# Patient Record
Sex: Male | Born: 1940 | Race: White | Hispanic: No | Marital: Married | Smoking: Never smoker
Health system: Southern US, Community
[De-identification: ages and names within clinical notes are randomized; demographics above are authoritative.]

## PROBLEM LIST (undated history)

## (undated) DIAGNOSIS — I10 Essential (primary) hypertension: Secondary | ICD-10-CM

## (undated) DIAGNOSIS — M5416 Radiculopathy, lumbar region: Secondary | ICD-10-CM

## (undated) DIAGNOSIS — Z9989 Dependence on other enabling machines and devices: Secondary | ICD-10-CM

## (undated) DIAGNOSIS — G4733 Obstructive sleep apnea (adult) (pediatric): Secondary | ICD-10-CM

## (undated) HISTORY — DX: Essential (primary) hypertension: I10

## (undated) HISTORY — DX: Radiculopathy, lumbar region: M54.16

## (undated) HISTORY — DX: Obstructive sleep apnea (adult) (pediatric): G47.33

## (undated) HISTORY — DX: Dependence on other enabling machines and devices: Z99.89

---

## 2013-03-24 DIAGNOSIS — E785 Hyperlipidemia, unspecified: Secondary | ICD-10-CM | POA: Diagnosis not present

## 2013-03-24 DIAGNOSIS — Z79899 Other long term (current) drug therapy: Secondary | ICD-10-CM | POA: Diagnosis not present

## 2013-03-31 DIAGNOSIS — R7301 Impaired fasting glucose: Secondary | ICD-10-CM | POA: Diagnosis not present

## 2013-03-31 DIAGNOSIS — E785 Hyperlipidemia, unspecified: Secondary | ICD-10-CM | POA: Diagnosis not present

## 2013-03-31 DIAGNOSIS — Z79899 Other long term (current) drug therapy: Secondary | ICD-10-CM | POA: Diagnosis not present

## 2013-03-31 DIAGNOSIS — K7689 Other specified diseases of liver: Secondary | ICD-10-CM | POA: Diagnosis not present

## 2013-03-31 DIAGNOSIS — I10 Essential (primary) hypertension: Secondary | ICD-10-CM | POA: Diagnosis not present

## 2013-04-29 DIAGNOSIS — K7689 Other specified diseases of liver: Secondary | ICD-10-CM | POA: Diagnosis not present

## 2013-06-22 DIAGNOSIS — Z23 Encounter for immunization: Secondary | ICD-10-CM | POA: Diagnosis not present

## 2013-10-01 DIAGNOSIS — I10 Essential (primary) hypertension: Secondary | ICD-10-CM | POA: Diagnosis not present

## 2013-10-01 DIAGNOSIS — R7301 Impaired fasting glucose: Secondary | ICD-10-CM | POA: Diagnosis not present

## 2013-10-01 DIAGNOSIS — E785 Hyperlipidemia, unspecified: Secondary | ICD-10-CM | POA: Diagnosis not present

## 2013-10-01 DIAGNOSIS — Z79899 Other long term (current) drug therapy: Secondary | ICD-10-CM | POA: Diagnosis not present

## 2013-10-06 DIAGNOSIS — F411 Generalized anxiety disorder: Secondary | ICD-10-CM | POA: Diagnosis not present

## 2013-10-06 DIAGNOSIS — R710 Precipitous drop in hematocrit: Secondary | ICD-10-CM | POA: Diagnosis not present

## 2013-10-06 DIAGNOSIS — I10 Essential (primary) hypertension: Secondary | ICD-10-CM | POA: Diagnosis not present

## 2013-10-06 DIAGNOSIS — Z23 Encounter for immunization: Secondary | ICD-10-CM | POA: Diagnosis not present

## 2013-10-06 DIAGNOSIS — R7301 Impaired fasting glucose: Secondary | ICD-10-CM | POA: Diagnosis not present

## 2013-10-06 DIAGNOSIS — E785 Hyperlipidemia, unspecified: Secondary | ICD-10-CM | POA: Diagnosis not present

## 2013-10-06 DIAGNOSIS — D539 Nutritional anemia, unspecified: Secondary | ICD-10-CM | POA: Diagnosis not present

## 2013-10-18 DIAGNOSIS — Z23 Encounter for immunization: Secondary | ICD-10-CM | POA: Diagnosis not present

## 2014-04-06 DIAGNOSIS — Z79899 Other long term (current) drug therapy: Secondary | ICD-10-CM | POA: Diagnosis not present

## 2014-04-06 DIAGNOSIS — E785 Hyperlipidemia, unspecified: Secondary | ICD-10-CM | POA: Diagnosis not present

## 2014-04-06 LAB — LIPID PANEL
CHOLESTEROL: 182 mg/dL (ref 0–200)
HDL: 48 mg/dL (ref 35–70)
LDL CALC: 85 mg/dL
Triglycerides: 241 mg/dL — AB (ref 40–160)

## 2014-04-06 LAB — BASIC METABOLIC PANEL
BUN: 22 mg/dL — AB (ref 4–21)
Creatinine: 1 mg/dL (ref 0.6–1.3)
GLUCOSE: 108 mg/dL
Potassium: 4.1 mmol/L (ref 3.4–5.3)
Sodium: 146 mmol/L (ref 137–147)

## 2014-04-06 LAB — HEPATIC FUNCTION PANEL
ALT: 34 U/L (ref 10–40)
AST: 34 U/L (ref 14–40)

## 2014-04-06 LAB — HEMOGLOBIN A1C: Hemoglobin A1C: 5.7

## 2014-04-14 DIAGNOSIS — I10 Essential (primary) hypertension: Secondary | ICD-10-CM | POA: Diagnosis not present

## 2014-04-14 DIAGNOSIS — Z79899 Other long term (current) drug therapy: Secondary | ICD-10-CM | POA: Diagnosis not present

## 2014-04-14 DIAGNOSIS — Z23 Encounter for immunization: Secondary | ICD-10-CM | POA: Diagnosis not present

## 2014-04-14 DIAGNOSIS — D492 Neoplasm of unspecified behavior of bone, soft tissue, and skin: Secondary | ICD-10-CM | POA: Diagnosis not present

## 2014-04-14 DIAGNOSIS — E785 Hyperlipidemia, unspecified: Secondary | ICD-10-CM | POA: Diagnosis not present

## 2014-04-14 DIAGNOSIS — R7301 Impaired fasting glucose: Secondary | ICD-10-CM | POA: Diagnosis not present

## 2014-05-10 DIAGNOSIS — L570 Actinic keratosis: Secondary | ICD-10-CM | POA: Diagnosis not present

## 2014-05-10 DIAGNOSIS — Z85828 Personal history of other malignant neoplasm of skin: Secondary | ICD-10-CM | POA: Diagnosis not present

## 2014-05-10 DIAGNOSIS — Z08 Encounter for follow-up examination after completed treatment for malignant neoplasm: Secondary | ICD-10-CM | POA: Diagnosis not present

## 2014-05-10 DIAGNOSIS — C44310 Basal cell carcinoma of skin of unspecified parts of face: Secondary | ICD-10-CM | POA: Diagnosis not present

## 2014-05-31 DIAGNOSIS — C44310 Basal cell carcinoma of skin of unspecified parts of face: Secondary | ICD-10-CM | POA: Diagnosis not present

## 2014-07-14 DIAGNOSIS — E785 Hyperlipidemia, unspecified: Secondary | ICD-10-CM | POA: Diagnosis not present

## 2014-08-04 DIAGNOSIS — R7301 Impaired fasting glucose: Secondary | ICD-10-CM | POA: Diagnosis not present

## 2014-08-04 DIAGNOSIS — I10 Essential (primary) hypertension: Secondary | ICD-10-CM | POA: Diagnosis not present

## 2014-08-04 DIAGNOSIS — E785 Hyperlipidemia, unspecified: Secondary | ICD-10-CM | POA: Diagnosis not present

## 2014-08-04 DIAGNOSIS — R072 Precordial pain: Secondary | ICD-10-CM | POA: Diagnosis not present

## 2014-08-05 DIAGNOSIS — Z87891 Personal history of nicotine dependence: Secondary | ICD-10-CM | POA: Diagnosis not present

## 2014-08-05 DIAGNOSIS — F419 Anxiety disorder, unspecified: Secondary | ICD-10-CM | POA: Diagnosis not present

## 2014-08-05 DIAGNOSIS — G4733 Obstructive sleep apnea (adult) (pediatric): Secondary | ICD-10-CM | POA: Diagnosis not present

## 2014-08-05 DIAGNOSIS — R079 Chest pain, unspecified: Secondary | ICD-10-CM | POA: Diagnosis not present

## 2014-08-05 DIAGNOSIS — J449 Chronic obstructive pulmonary disease, unspecified: Secondary | ICD-10-CM | POA: Diagnosis not present

## 2014-08-05 DIAGNOSIS — E785 Hyperlipidemia, unspecified: Secondary | ICD-10-CM | POA: Diagnosis not present

## 2014-08-05 DIAGNOSIS — I10 Essential (primary) hypertension: Secondary | ICD-10-CM | POA: Diagnosis not present

## 2014-08-05 DIAGNOSIS — Z7982 Long term (current) use of aspirin: Secondary | ICD-10-CM | POA: Diagnosis not present

## 2014-08-06 DIAGNOSIS — E785 Hyperlipidemia, unspecified: Secondary | ICD-10-CM | POA: Diagnosis not present

## 2014-08-06 DIAGNOSIS — I10 Essential (primary) hypertension: Secondary | ICD-10-CM | POA: Diagnosis not present

## 2014-08-06 DIAGNOSIS — R079 Chest pain, unspecified: Secondary | ICD-10-CM | POA: Diagnosis not present

## 2014-08-06 DIAGNOSIS — R0789 Other chest pain: Secondary | ICD-10-CM | POA: Diagnosis not present

## 2014-08-06 DIAGNOSIS — J449 Chronic obstructive pulmonary disease, unspecified: Secondary | ICD-10-CM | POA: Diagnosis not present

## 2014-08-11 DIAGNOSIS — L989 Disorder of the skin and subcutaneous tissue, unspecified: Secondary | ICD-10-CM | POA: Diagnosis not present

## 2014-08-11 DIAGNOSIS — I10 Essential (primary) hypertension: Secondary | ICD-10-CM | POA: Diagnosis not present

## 2014-08-11 DIAGNOSIS — R072 Precordial pain: Secondary | ICD-10-CM | POA: Diagnosis not present

## 2014-08-11 DIAGNOSIS — F419 Anxiety disorder, unspecified: Secondary | ICD-10-CM | POA: Diagnosis not present

## 2014-08-24 DIAGNOSIS — J449 Chronic obstructive pulmonary disease, unspecified: Secondary | ICD-10-CM | POA: Diagnosis not present

## 2014-08-24 DIAGNOSIS — R072 Precordial pain: Secondary | ICD-10-CM | POA: Diagnosis not present

## 2014-08-24 DIAGNOSIS — E782 Mixed hyperlipidemia: Secondary | ICD-10-CM | POA: Diagnosis not present

## 2014-08-24 DIAGNOSIS — I10 Essential (primary) hypertension: Secondary | ICD-10-CM | POA: Diagnosis not present

## 2014-08-30 DIAGNOSIS — L219 Seborrheic dermatitis, unspecified: Secondary | ICD-10-CM | POA: Diagnosis not present

## 2014-09-15 DIAGNOSIS — E785 Hyperlipidemia, unspecified: Secondary | ICD-10-CM | POA: Diagnosis not present

## 2014-09-15 DIAGNOSIS — J309 Allergic rhinitis, unspecified: Secondary | ICD-10-CM | POA: Diagnosis not present

## 2014-09-15 DIAGNOSIS — F419 Anxiety disorder, unspecified: Secondary | ICD-10-CM | POA: Diagnosis not present

## 2014-09-15 DIAGNOSIS — I10 Essential (primary) hypertension: Secondary | ICD-10-CM | POA: Diagnosis not present

## 2014-09-21 DIAGNOSIS — Z23 Encounter for immunization: Secondary | ICD-10-CM | POA: Diagnosis not present

## 2014-09-21 DIAGNOSIS — I10 Essential (primary) hypertension: Secondary | ICD-10-CM | POA: Diagnosis not present

## 2014-09-21 DIAGNOSIS — R072 Precordial pain: Secondary | ICD-10-CM | POA: Diagnosis not present

## 2014-09-21 DIAGNOSIS — E785 Hyperlipidemia, unspecified: Secondary | ICD-10-CM | POA: Diagnosis not present

## 2014-09-21 DIAGNOSIS — R7301 Impaired fasting glucose: Secondary | ICD-10-CM | POA: Diagnosis not present

## 2014-09-21 DIAGNOSIS — Z79899 Other long term (current) drug therapy: Secondary | ICD-10-CM | POA: Diagnosis not present

## 2014-11-29 DIAGNOSIS — C4441 Basal cell carcinoma of skin of scalp and neck: Secondary | ICD-10-CM | POA: Diagnosis not present

## 2014-11-29 DIAGNOSIS — L718 Other rosacea: Secondary | ICD-10-CM | POA: Diagnosis not present

## 2015-02-20 ENCOUNTER — Encounter: Payer: Self-pay | Admitting: Family Medicine

## 2015-02-20 ENCOUNTER — Ambulatory Visit (INDEPENDENT_AMBULATORY_CARE_PROVIDER_SITE_OTHER): Payer: Medicare Other | Admitting: Family Medicine

## 2015-02-20 VITALS — BP 137/75 | HR 65 | Ht 66.54 in | Wt 189.0 lb

## 2015-02-20 DIAGNOSIS — Z9989 Dependence on other enabling machines and devices: Secondary | ICD-10-CM

## 2015-02-20 DIAGNOSIS — E785 Hyperlipidemia, unspecified: Secondary | ICD-10-CM | POA: Diagnosis not present

## 2015-02-20 DIAGNOSIS — L719 Rosacea, unspecified: Secondary | ICD-10-CM | POA: Insufficient documentation

## 2015-02-20 DIAGNOSIS — I10 Essential (primary) hypertension: Secondary | ICD-10-CM | POA: Diagnosis not present

## 2015-02-20 DIAGNOSIS — G4733 Obstructive sleep apnea (adult) (pediatric): Secondary | ICD-10-CM

## 2015-02-20 DIAGNOSIS — R413 Other amnesia: Secondary | ICD-10-CM

## 2015-02-20 DIAGNOSIS — M5416 Radiculopathy, lumbar region: Secondary | ICD-10-CM | POA: Diagnosis not present

## 2015-02-20 DIAGNOSIS — E781 Pure hyperglyceridemia: Secondary | ICD-10-CM | POA: Insufficient documentation

## 2015-02-20 HISTORY — DX: Radiculopathy, lumbar region: M54.16

## 2015-02-20 HISTORY — DX: Essential (primary) hypertension: I10

## 2015-02-20 HISTORY — DX: Obstructive sleep apnea (adult) (pediatric): G47.33

## 2015-02-20 MED ORDER — GABAPENTIN 300 MG PO CAPS: 300.0000 mg | ORAL_CAPSULE | Freq: Every evening | ORAL | Status: DC | PRN

## 2015-02-20 NOTE — Progress Notes (Addendum)
CC: Anthony Davila is a 75 y.o. male is here for Establish Care; short term memory loss; and Medication Management   Subjective: HPI:  Very pleasant 75 year old here to establish care  He reports a history of essential hypertension. He is not sure how long he's had this but is currently on metoprolol and Hyzaar with no outside blood pressures report. He denies any known side effects. No chest pain shortness of breath or peripheral edema  He has a history of hyperlipidemia and hypertriglyceridemia. He is currently on atorvastatin and fenofibrate acid. He wants to stop taking medications if possible. He tells me his hypertriglyceridemia was diagnosed about a year ago and he's never had this problem before.  Complains of midline low back pain that radiates down the left leg that has been present ever since he lifted a heavy object 2013 and had sudden onset of pain. He's received epidural injections but they were not entirely beneficial. He's currently not on any medication for this issue. He complains of daily numbness and pain in his left foot without any weakness.  He has a history of rosacea and is currently taking minocycline. He denies any facial or skin lesions.  His major complaint today is memory loss that has been identified by family members and also the patient experiences difficulty with occasional word finding. He denies any giving lost or disorientation. Symptoms of an slowly more noticeable over the past year. Symptoms occur on a daily basis to a mild degree. Nothing seems to make symptoms better or worse  Review of Systems - General ROS: negative for - chills, fever, night sweats, weight gain or weight loss Ophthalmic ROS: negative for - decreased vision Psychological ROS: negative for - anxiety or depression ENT ROS: negative for - hearing change, nasal congestion, tinnitus or allergies Hematological and Lymphatic ROS: negative for - bleeding problems, bruising or swollen lymph  nodes Breast ROS: negative Respiratory ROS: no cough, shortness of breath, or wheezing Cardiovascular ROS: no chest pain or dyspnea on exertion Gastrointestinal ROS: no abdominal pain, change in bowel habits, or black or bloody stools Genito-Urinary ROS: negative for - genital discharge, genital ulcers, incontinence or abnormal bleeding from genitals Musculoskeletal ROS: negative for - joint pain or muscle pain other than that described above Neurological ROS: negative for - headaches Dermatological ROS: negative for lumps, mole changes, rash and skin lesion changes  Past Medical History  Diagnosis Date  . Essential hypertension 02/20/2015  . OSA on CPAP 02/20/2015  . Left lumbar radiculitis 02/20/2015    History reviewed. No pertinent past surgical history. History reviewed. No pertinent family history.  Social History   Social History  . Marital Status: Married    Spouse Name: N/A  . Number of Children: N/A  . Years of Education: N/A   Occupational History  . Not on file.   Social History Main Topics  . Smoking status: Never Smoker   . Smokeless tobacco: Not on file  . Alcohol Use: Not on file  . Drug Use: Not on file  . Sexual Activity: Not on file   Other Topics Concern  . Not on file   Social History Narrative     Objective: BP 137/75 mmHg  Pulse 65  Ht 5' 6.53" (1.69 m)  Wt 189 lb (85.73 kg)  BMI 30.02 kg/m2  General: Alert and Oriented, No Acute Distress HEENT: Pupils equal, round, reactive to light. Conjunctivae clear. Moist mucous membranes Lungs: Clear to auscultation bilaterally, no wheezing/ronchi/rales.  Comfortable work  of breathing. Good air movement. Cardiac: Regular rate and rhythm. Normal S1/S2.  No murmurs, rubs, nor gallops.   Extremities: No peripheral edema.  Strong peripheral pulses.  Mental Status: No depression, anxiety, nor agitation. Skin: Warm and dry.  Assessment & Plan: Anthony Davila was seen today for establish care, short term memory  loss and medication management.  Diagnoses and all orders for this visit:  Essential hypertension  Hyperlipidemia  OSA on CPAP  Left lumbar radiculitis  Hypertriglyceridemia -     Lipid panel  Memory loss -     Vitamin B12 -     Vitamin D (25 hydroxy) -     TSH  Rosacea  Other orders -     gabapentin (NEURONTIN) 300 MG capsule; Take 1 capsule (300 mg total) by mouth at bedtime and may repeat dose one time if needed. To prevent nerve pain.   Essential hypertension: Controlled continue metoprolol and Hyzaar  hyperlipidemia: Since he wants to stop some medication if possible he will Stop fenofibrate acid and we will check lipid panel in 2-3 weeks. Left lumbar radiculitis: Trial of gabapentin at night. Memory loss: MMSE today was  27 out of 30. Will look for reversible causes with labs above. Rosacea: Controlled continue minocycline.  He does not need refills on his medications today.  Return in about 4 weeks (around 03/20/2015).

## 2015-02-23 ENCOUNTER — Encounter: Payer: Self-pay | Admitting: Family Medicine

## 2015-03-06 ENCOUNTER — Other Ambulatory Visit: Payer: Self-pay

## 2015-03-06 DIAGNOSIS — R413 Other amnesia: Secondary | ICD-10-CM | POA: Diagnosis not present

## 2015-03-06 DIAGNOSIS — E781 Pure hyperglyceridemia: Secondary | ICD-10-CM | POA: Diagnosis not present

## 2015-03-06 MED ORDER — LOSARTAN POTASSIUM-HCTZ 100-12.5 MG PO TABS: 1.0000 | ORAL_TABLET | Freq: Every day | ORAL | Status: DC

## 2015-03-06 MED ORDER — SERTRALINE HCL 50 MG PO TABS
50.0000 mg | ORAL_TABLET | Freq: Every day | ORAL | Status: DC
Start: 2015-03-06 — End: 2015-05-07

## 2015-03-06 MED ORDER — ATORVASTATIN CALCIUM 20 MG PO TABS: 20.0000 mg | ORAL_TABLET | Freq: Every day | ORAL | Status: DC

## 2015-03-07 ENCOUNTER — Telehealth: Payer: Self-pay | Admitting: Family Medicine

## 2015-03-07 DIAGNOSIS — E559 Vitamin D deficiency, unspecified: Secondary | ICD-10-CM | POA: Insufficient documentation

## 2015-03-07 LAB — LIPID PANEL
CHOL/HDL RATIO: 4.1 ratio (ref <=5.0)
CHOLESTEROL: 131 mg/dL (ref 125–200)
HDL: 32 mg/dL — AB (ref >=40)
LDL Cholesterol: 44 mg/dL (ref <130)
Triglycerides: 277 mg/dL — ABNORMAL HIGH (ref <150)
VLDL: 55 mg/dL — ABNORMAL HIGH (ref <30)

## 2015-03-07 LAB — VITAMIN B12: Vitamin B-12: 944 pg/mL (ref 200–1100)

## 2015-03-07 LAB — TSH: TSH: 4.49 mIU/L (ref 0.40–4.50)

## 2015-03-07 LAB — VITAMIN D 25 HYDROXY (VIT D DEFICIENCY, FRACTURES): Vit D, 25-Hydroxy: 24 ng/mL — ABNORMAL LOW (ref 30–100)

## 2015-03-07 MED ORDER — VITAMIN D (ERGOCALCIFEROL) 1.25 MG (50000 UNIT) PO CAPS: 50000.0000 [IU] | ORAL_CAPSULE | ORAL | Status: DC

## 2015-03-07 NOTE — Telephone Encounter (Signed)
Awaiting call back.

## 2015-03-07 NOTE — Telephone Encounter (Signed)
Pt.notified

## 2015-03-07 NOTE — Telephone Encounter (Signed)
Will you please let patient know that his thyroid function, vitamin B, and LDL cholesterol were all normal. His vitamin D level was deficient which could be the cause of his memory loss, I"ll send in weekly supplement and if this doesn't help after one month please follow up to discuss other medication options.

## 2015-03-15 ENCOUNTER — Other Ambulatory Visit: Payer: Self-pay

## 2015-03-15 MED ORDER — METOPROLOL TARTRATE 25 MG PO TABS: ORAL_TABLET | ORAL | Status: DC

## 2015-03-20 ENCOUNTER — Encounter: Payer: Self-pay | Admitting: Family Medicine

## 2015-03-20 ENCOUNTER — Ambulatory Visit (INDEPENDENT_AMBULATORY_CARE_PROVIDER_SITE_OTHER): Payer: Medicare Other | Admitting: Family Medicine

## 2015-03-20 VITALS — BP 120/71 | HR 61 | Wt 192.0 lb

## 2015-03-20 DIAGNOSIS — R413 Other amnesia: Secondary | ICD-10-CM | POA: Diagnosis not present

## 2015-03-20 DIAGNOSIS — F411 Generalized anxiety disorder: Secondary | ICD-10-CM | POA: Diagnosis not present

## 2015-03-20 DIAGNOSIS — M5416 Radiculopathy, lumbar region: Secondary | ICD-10-CM

## 2015-03-20 DIAGNOSIS — E559 Vitamin D deficiency, unspecified: Secondary | ICD-10-CM

## 2015-03-20 DIAGNOSIS — H9313 Tinnitus, bilateral: Secondary | ICD-10-CM | POA: Diagnosis not present

## 2015-03-20 MED ORDER — GABAPENTIN 600 MG PO TABS: 600.0000 mg | ORAL_TABLET | Freq: Every day | ORAL | Status: DC

## 2015-03-20 MED ORDER — METOPROLOL TARTRATE 25 MG PO TABS: ORAL_TABLET | ORAL | Status: DC

## 2015-03-20 NOTE — Progress Notes (Signed)
CC: Anthony Davila is a 75 y.o. male is here for Memory Loss; Leg Pain; Headache; Panic Attack; and Chest Pain   Subjective: HPI:  Follow-up memory loss: Family and patient has not noticed any further short-term memory loss since I saw him last. No disorientation or getting lost. No recent behavior outbursts. He doesn't symptoms are mild and occurring on a daily basis. He denies any long-term memory loss. He is able to remember and recite all medication changes in our office in outside offices over the past 3 months without difficulty.  Complains of tinnitus bilaterally. It's been going on for years. It slightly improved when taking Lipoflavanoid however the medication is getting too costly and I wonder if there something else that's generic. He denies ear pain or hearing loss.  Follow-up anxiety: He is been taking Zoloft now for over a year. He denies any anxiety but is worried he may have had a panic attack on Sunday of last week. It was described as a chilly sensation with abdominal cramping, it lasted a few minutes and occurred at rest. He tells me he has not been to be anxious about and denies any other mental disturbance.  Gabapentin has been helping him stay asleep and ignore left lumbar radiculitis at night. He denies any known side effects. His symptoms remain to a mild degree in the daytime. Denies any other motor or sensory disturbances other than pain   Review Of Systems Outlined In HPI  Past Medical History  Diagnosis Date  . Essential hypertension 02/20/2015  . OSA on CPAP 02/20/2015  . Left lumbar radiculitis 02/20/2015    No past surgical history on file. No family history on file.  Social History   Social History  . Marital Status: Married    Spouse Name: N/A  . Number of Children: N/A  . Years of Education: N/A   Occupational History  . Not on file.   Social History Main Topics  . Smoking status: Never Smoker   . Smokeless tobacco: Not on file  . Alcohol Use: Not  on file  . Drug Use: Not on file  . Sexual Activity: Not on file   Other Topics Concern  . Not on file   Social History Narrative     Objective: BP 120/71 mmHg  Pulse 61  Wt 192 lb (87.091 kg)  General: Alert and Oriented, No Acute Distress HEENT: Pupils equal, round, reactive to light. Conjunctivae clear.  External ears unremarkable, canals clear with intact TMs with appropriate landmarks.  Middle ear appears open without effusion. Pink inferior turbinates.  Moist mucous membranes, pharynx without inflammation nor lesions.  Neck supple without palpable lymphadenopathy nor abnormal masses. Lungs: Clear to auscultation bilaterally, no wheezing/ronchi/rales.  Comfortable work of breathing. Good air movement. Cardiac: Regular rate and rhythm. Normal S1/S2.  No murmurs, rubs, nor gallops.   Extremities: No peripheral edema.  Strong peripheral pulses.  Mental Status: No depression, anxiety, nor agitation. Skin: Warm and dry.  Assessment & Plan: Browning was seen today for memory loss, leg pain, headache, panic attack and chest pain.  Diagnoses and all orders for this visit:  Memory loss  Vitamin D deficiency  Tinnitus, bilateral  Generalized anxiety disorder  Left lumbar radiculitis  Other orders -     metoprolol tartrate (LOPRESSOR) 25 MG tablet; TAKE Half TABLET 2 TIMES A DAY for blood pressure control. -     gabapentin (NEURONTIN) 600 MG tablet; Take 1 tablet (600 mg total) by mouth at bedtime.  To help nerve pain in leg.   Memory loss: Stable no further intervention other than continue full 3 months of weekly vitamin D supplementation Bilateral tinnitus: Trial of over-the-counter fish oil since this is the main ingredient with Lipoflavanoid Anxiety: Controlled with Zoloft Left lumbar radiculitis: Improvement with gabapentin, increasing dose  Has been experiencing some lightheadedness when going from a seated to standing position therefore cutting back on  metoprolol.  Return in about 3 months (around 06/20/2015) for Vitamin D Recheck.

## 2015-03-24 ENCOUNTER — Encounter: Payer: Self-pay | Admitting: Family Medicine

## 2015-05-07 ENCOUNTER — Other Ambulatory Visit: Payer: Self-pay | Admitting: Family Medicine

## 2015-05-30 DIAGNOSIS — Z08 Encounter for follow-up examination after completed treatment for malignant neoplasm: Secondary | ICD-10-CM | POA: Diagnosis not present

## 2015-05-30 DIAGNOSIS — L219 Seborrheic dermatitis, unspecified: Secondary | ICD-10-CM | POA: Diagnosis not present

## 2015-05-30 DIAGNOSIS — L570 Actinic keratosis: Secondary | ICD-10-CM | POA: Diagnosis not present

## 2015-05-30 DIAGNOSIS — Z85828 Personal history of other malignant neoplasm of skin: Secondary | ICD-10-CM | POA: Diagnosis not present

## 2015-06-08 ENCOUNTER — Other Ambulatory Visit: Payer: Self-pay | Admitting: Family Medicine

## 2015-06-13 ENCOUNTER — Other Ambulatory Visit: Payer: Self-pay | Admitting: Family Medicine

## 2015-06-21 ENCOUNTER — Ambulatory Visit: Payer: Medicare Other | Admitting: Family Medicine

## 2015-06-26 ENCOUNTER — Ambulatory Visit (INDEPENDENT_AMBULATORY_CARE_PROVIDER_SITE_OTHER): Payer: Medicare Other | Admitting: Family Medicine

## 2015-06-26 ENCOUNTER — Encounter: Payer: Self-pay | Admitting: Family Medicine

## 2015-06-26 VITALS — BP 128/80 | HR 65 | Wt 189.0 lb

## 2015-06-26 DIAGNOSIS — I10 Essential (primary) hypertension: Secondary | ICD-10-CM

## 2015-06-26 DIAGNOSIS — E559 Vitamin D deficiency, unspecified: Secondary | ICD-10-CM | POA: Diagnosis not present

## 2015-06-26 DIAGNOSIS — M5416 Radiculopathy, lumbar region: Secondary | ICD-10-CM

## 2015-06-26 DIAGNOSIS — R079 Chest pain, unspecified: Secondary | ICD-10-CM

## 2015-06-26 DIAGNOSIS — R1031 Right lower quadrant pain: Secondary | ICD-10-CM | POA: Diagnosis not present

## 2015-06-26 LAB — CBC WITH DIFFERENTIAL/PLATELET
BASOS PCT: 0 %
Basophils Absolute: 0 cells/uL (ref 0–200)
EOS PCT: 5 %
Eosinophils Absolute: 265 cells/uL (ref 15–500)
HCT: 38.2 % — ABNORMAL LOW (ref 38.5–50.0)
HEMOGLOBIN: 13.1 g/dL — AB (ref 13.2–17.1)
LYMPHS ABS: 1431 {cells}/uL (ref 850–3900)
Lymphocytes Relative: 27 %
MCH: 32.3 pg (ref 27.0–33.0)
MCHC: 34.3 g/dL (ref 32.0–36.0)
MCV: 94.1 fL (ref 80.0–100.0)
MPV: 12.4 fL (ref 7.5–12.5)
Monocytes Absolute: 371 cells/uL (ref 200–950)
Monocytes Relative: 7 %
NEUTROS ABS: 3233 {cells}/uL (ref 1500–7800)
Neutrophils Relative %: 61 %
Platelets: 141 10*3/uL (ref 140–400)
RBC: 4.06 MIL/uL — AB (ref 4.20–5.80)
RDW: 13.9 % (ref 11.0–15.0)
WBC: 5.3 10*3/uL (ref 3.8–10.8)

## 2015-06-26 NOTE — Progress Notes (Signed)
CC: Anthony Davila is a 75 y.o. male is here for Follow-up   Subjective: HPI:  Follow-up vitamin D deficiency: He is taking 3 full months of vitamin D supplementation. He tells me he is not sure if it helped with his memory but he doesn't see anything that occurred because of taking the medication.  Follow essential hypertension: Since lowering his metoprolol he is no longer having any orthostatic like symptoms. No outside blood pressures to report.  He has a history of left lumbar radiculitis. He once received injections that were most likely epidural injections. He is having worsening pain that runs down the back of the left leg and ends with burning from the ankle distally. He denies any overlying skin changes or any other motor or sensory disturbances. Gabapentin is becoming more ineffective.  Complains of right lower quadrant plane that has been present for the last week. He's not sure what makes it better or worse. Described as dull and nonradiating. No relation to diet or bowel movement habits. He denies any nausea, vomiting, decreased appetite, constipation or diarrhea. He's worried that this might be coming from his appendix. He denies fevers, chills or flushing  He's also been having some chest discomfort for the past 5 months. He describes it as "it feels like my heart is suspended in the chest". He denies any exertional component to his pain, it's not completely by shortness of breath, he cannot influence the pain with breathing or any positions of his chest. Denies cough or wheezing. Pain is present on a daily basis and mild in severity at its worst. A little less than a year ago he had a normal stress test   Review Of Systems Outlined In HPI  Past Medical History  Diagnosis Date  . Essential hypertension 02/20/2015  . OSA on CPAP 02/20/2015  . Left lumbar radiculitis 02/20/2015    No past surgical history on file. No family history on file.  Social History   Social History  .  Marital Status: Married    Spouse Name: N/A  . Number of Children: N/A  . Years of Education: N/A   Occupational History  . Not on file.   Social History Main Topics  . Smoking status: Never Smoker   . Smokeless tobacco: Not on file  . Alcohol Use: Not on file  . Drug Use: Not on file  . Sexual Activity: Not on file   Other Topics Concern  . Not on file   Social History Narrative     Objective: BP 128/80 mmHg  Pulse 65  Wt 189 lb (85.73 kg)  General: Alert and Oriented, No Acute Distress HEENT: Pupils equal, round, reactive to light. Conjunctivae clear. Moist mucous membranes Lungs: Clear to auscultation bilaterally, no wheezing/ronchi/rales.  Comfortable work of breathing. Good air movement. Cardiac: Regular rate and rhythm. Normal S1/S2.  No murmurs, rubs, nor gallops.   Abdomen: Normal bowel sounds, soft and non tender without palpable masses. Reproduction of pain with palpation in the right lower quadrant nor any rebound tenderness Extremities: No peripheral edema.  Strong peripheral pulses.  Mental Status: No depression, anxiety, nor agitation. Skin: Warm and dry.  Assessment & Plan: Anthony Davila was seen today for follow-up.  Diagnoses and all orders for this visit:  Vitamin D deficiency -     VITAMIN D 25 Hydroxy (Vit-D Deficiency, Fractures)  Essential hypertension  Left lumbar radiculitis -     Ambulatory referral to Neurology  RLQ abdominal pain -  CBC with Differential  Chest pain, unspecified chest pain type -     ECHO COMPLETE; Future   D deficiency: Due for repeat D level today Essential hypertension: Improving and controlled with no longer experiencing orthostatic symptoms Left lumbar radiculitis: Given his worsening discomfort and radicular pain I recommended nerve conduction study, they specifically would like Dr.Bey, referral placed Right lower quadrant pain: Checking Ngo count, low suspicion for appendicitis at this time given lack of  rebound tenderness Chest discomfort: Given and is not necessarily pain or discomfort per se but rather a weird sensation described as "suspended" we'll start with an echocardiogram rather than repeating a stress test  25 minutes spent face-to-face during visit today of which at least 50% was counseling or coordinating care regarding: 1. Vitamin D deficiency   2. Essential hypertension   3. Left lumbar radiculitis   4. RLQ abdominal pain   5. Chest pain, unspecified chest pain type      No Follow-up on file.

## 2015-06-27 ENCOUNTER — Telehealth: Payer: Self-pay | Admitting: Family Medicine

## 2015-06-27 LAB — VITAMIN D 25 HYDROXY (VIT D DEFICIENCY, FRACTURES): VIT D 25 HYDROXY: 33 ng/mL (ref 30–100)

## 2015-06-27 MED ORDER — DICYCLOMINE HCL 10 MG PO CAPS: 10.0000 mg | ORAL_CAPSULE | Freq: Three times a day (TID) | ORAL | Status: DC | PRN

## 2015-06-27 MED ORDER — VITAMIN D 1000 UNITS PO TABS: 1000.0000 [IU] | ORAL_TABLET | Freq: Every day | ORAL | Status: DC

## 2015-06-27 NOTE — Telephone Encounter (Signed)
Results and recommendations left on daughter Anthony Davila) vm

## 2015-06-27 NOTE — Telephone Encounter (Signed)
Will you please let patient know that his vitamin D level is now normal and can be kept normal by taking a daily 1000 unit OTC vitamin D supplement.  Also his Blume blood cell count was normal which makes appendicitis very unlikely.  I suspect his abdominal discomfort is coming some some colon spasms that can be relieved by taking a new medication called dicyclomine on an as needed basis, this was sent [facility].

## 2015-06-29 ENCOUNTER — Other Ambulatory Visit: Payer: Self-pay | Admitting: Family Medicine

## 2015-07-10 ENCOUNTER — Other Ambulatory Visit: Payer: Self-pay | Admitting: Family Medicine

## 2015-07-24 ENCOUNTER — Other Ambulatory Visit: Payer: Self-pay | Admitting: Family Medicine

## 2015-08-17 DIAGNOSIS — M5432 Sciatica, left side: Secondary | ICD-10-CM | POA: Diagnosis not present

## 2015-08-17 DIAGNOSIS — G629 Polyneuropathy, unspecified: Secondary | ICD-10-CM | POA: Diagnosis not present

## 2015-08-23 ENCOUNTER — Other Ambulatory Visit: Payer: Self-pay | Admitting: Family Medicine

## 2015-09-07 ENCOUNTER — Other Ambulatory Visit: Payer: Self-pay | Admitting: Family Medicine

## 2015-09-07 DIAGNOSIS — M5432 Sciatica, left side: Secondary | ICD-10-CM | POA: Diagnosis not present

## 2015-09-08 ENCOUNTER — Ambulatory Visit (INDEPENDENT_AMBULATORY_CARE_PROVIDER_SITE_OTHER): Payer: Medicare Other

## 2015-09-08 ENCOUNTER — Ambulatory Visit (INDEPENDENT_AMBULATORY_CARE_PROVIDER_SITE_OTHER): Payer: Medicare Other | Admitting: Physician Assistant

## 2015-09-08 ENCOUNTER — Encounter: Payer: Self-pay | Admitting: Physician Assistant

## 2015-09-08 VITALS — BP 136/72 | HR 70 | Ht 66.25 in | Wt 186.0 lb

## 2015-09-08 DIAGNOSIS — R51 Headache: Secondary | ICD-10-CM | POA: Diagnosis not present

## 2015-09-08 DIAGNOSIS — R519 Headache, unspecified: Secondary | ICD-10-CM

## 2015-09-08 DIAGNOSIS — M4802 Spinal stenosis, cervical region: Secondary | ICD-10-CM | POA: Diagnosis not present

## 2015-09-08 DIAGNOSIS — H9193 Unspecified hearing loss, bilateral: Secondary | ICD-10-CM

## 2015-09-08 DIAGNOSIS — M47812 Spondylosis without myelopathy or radiculopathy, cervical region: Secondary | ICD-10-CM | POA: Insufficient documentation

## 2015-09-08 DIAGNOSIS — M479 Spondylosis, unspecified: Secondary | ICD-10-CM | POA: Diagnosis not present

## 2015-09-08 DIAGNOSIS — M542 Cervicalgia: Secondary | ICD-10-CM

## 2015-09-08 DIAGNOSIS — R413 Other amnesia: Secondary | ICD-10-CM

## 2015-09-08 MED ORDER — ATORVASTATIN CALCIUM 20 MG PO TABS: 20.0000 mg | ORAL_TABLET | Freq: Every day | ORAL | 5 refills | Status: DC

## 2015-09-08 MED ORDER — SERTRALINE HCL 50 MG PO TABS
50.0000 mg | ORAL_TABLET | Freq: Every day | ORAL | 5 refills | Status: DC
Start: 2015-09-08 — End: 2016-02-06

## 2015-09-08 MED ORDER — CYCLOBENZAPRINE HCL 10 MG PO TABS: 10.0000 mg | ORAL_TABLET | Freq: Every day | ORAL | 1 refills | Status: DC

## 2015-09-08 MED ORDER — MELOXICAM 15 MG PO TABS
15.0000 mg | ORAL_TABLET | Freq: Every day | ORAL | 1 refills | Status: DC
Start: 2015-09-08 — End: 2015-11-27

## 2015-09-08 MED ORDER — GABAPENTIN 600 MG PO TABS
ORAL_TABLET | ORAL | 5 refills | Status: DC
Start: 2015-09-08 — End: 2016-03-15

## 2015-09-08 MED ORDER — METOPROLOL TARTRATE 25 MG PO TABS: ORAL_TABLET | ORAL | 5 refills | Status: DC

## 2015-09-08 MED ORDER — LOSARTAN POTASSIUM-HCTZ 100-12.5 MG PO TABS: 1.0000 | ORAL_TABLET | Freq: Every day | ORAL | 5 refills | Status: DC

## 2015-09-08 NOTE — Progress Notes (Signed)
   Subjective:    Patient ID: Anthony Davila, male    DOB: Nov 22, 1940, 75 y.o.   MRN: [id number]  HPI  Patient is a 75 year old male who presents to the clinic to reestablish care with myself. Dr. Barbaraann Barthel is sleeping the office and he was a patient of his.  Dr. Barbaraann Barthel had started him on vitamin D to help with his short-term memory loss. For a while he did think this was helping. They have since noticed a lot of memory decline. There are even been Sunday school teachers that have discussed this with his wife and daughter. He seems to be asking the same questions over and over again. Daughter is very concerned about him driving a motor vehicle. At times he does not seem to comprehend what is going on.   They also are concerned because he's having frequent headaches. For the last 4 weeks he has had a headache almost every day. He describes the headaches as more in the back of his head and he feels like his head "is about to explode". His been taking ibuprofen to 400 mg tablets multiple times a day. He denies any light sensitivity, sound sensitivity, smell sensitivity. He denies any nausea or vomiting. He does not have a history of migraines. He does admit some neck pain. He denies any neck injury. He often feels like his neck is stiff. He does admit to not doing anything during the day. He seems to watch TV most of the day.  They are also aware that his hearing has declined. They're wondering if some of this is due to his hearing loss.  Review of Systems    see HPI>  Objective:   Physical Exam  Constitutional: He is oriented to person, place, and time. He appears well-developed and well-nourished.  HENT:  Head: Normocephalic and atraumatic.  Cardiovascular: Normal rate, regular rhythm and normal heart sounds.   Pulmonary/Chest: Effort normal and breath sounds normal.  Neurological: He is alert and oriented to person, place, and time.  Had to repeat commands a few times to understand what to do.    Psychiatric: He has a normal mood and affect. His behavior is normal.          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 Will get cervical spine xray. Given flexeril to take 1/2 tablet at bedtime to help relax muscles. mobic to take daily. Encouraged warm compresses, good supportive pillow and good neck support while watching TV all day. Massages could also help. Follow up as needed.   Memory changes- family very concerned. MMSE was normal at first visit with Dr. Ileene Rubens at 27/30. Pt's family request neurology referral. Talked with patient and he agrees not to drive until appt. Pt continues on Vitamin D.   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 Pt has medicare. Gave options on where to go for affordable hearing aids. Some of his repeating and confusion could be due to hearing loss.   Spent 30 minutes with patient and greater than 50 percent of visit spent counseling patient regarding treatment plan.

## 2015-10-05 DIAGNOSIS — M5117 Intervertebral disc disorders with radiculopathy, lumbosacral region: Secondary | ICD-10-CM | POA: Diagnosis not present

## 2015-10-05 DIAGNOSIS — M4726 Other spondylosis with radiculopathy, lumbar region: Secondary | ICD-10-CM | POA: Diagnosis not present

## 2015-10-05 DIAGNOSIS — M4727 Other spondylosis with radiculopathy, lumbosacral region: Secondary | ICD-10-CM | POA: Diagnosis not present

## 2015-10-05 DIAGNOSIS — M5116 Intervertebral disc disorders with radiculopathy, lumbar region: Secondary | ICD-10-CM | POA: Diagnosis not present

## 2015-10-05 DIAGNOSIS — M4806 Spinal stenosis, lumbar region: Secondary | ICD-10-CM | POA: Diagnosis not present

## 2015-10-05 DIAGNOSIS — M47816 Spondylosis without myelopathy or radiculopathy, lumbar region: Secondary | ICD-10-CM | POA: Diagnosis not present

## 2015-10-19 DIAGNOSIS — I10 Essential (primary) hypertension: Secondary | ICD-10-CM | POA: Diagnosis not present

## 2015-10-19 DIAGNOSIS — M48062 Spinal stenosis, lumbar region with neurogenic claudication: Secondary | ICD-10-CM | POA: Diagnosis not present

## 2015-11-02 DIAGNOSIS — I10 Essential (primary) hypertension: Secondary | ICD-10-CM | POA: Diagnosis not present

## 2015-11-02 DIAGNOSIS — R413 Other amnesia: Secondary | ICD-10-CM | POA: Diagnosis not present

## 2015-11-02 DIAGNOSIS — R5383 Other fatigue: Secondary | ICD-10-CM | POA: Diagnosis not present

## 2015-11-02 DIAGNOSIS — R51 Headache: Secondary | ICD-10-CM | POA: Diagnosis not present

## 2015-11-02 DIAGNOSIS — R410 Disorientation, unspecified: Secondary | ICD-10-CM | POA: Diagnosis not present

## 2015-11-03 ENCOUNTER — Encounter: Payer: Self-pay | Admitting: Physician Assistant

## 2015-11-03 DIAGNOSIS — R519 Headache, unspecified: Secondary | ICD-10-CM | POA: Insufficient documentation

## 2015-11-03 DIAGNOSIS — R51 Headache: Secondary | ICD-10-CM

## 2015-11-10 DIAGNOSIS — R410 Disorientation, unspecified: Secondary | ICD-10-CM | POA: Diagnosis not present

## 2015-11-10 DIAGNOSIS — R51 Headache: Secondary | ICD-10-CM | POA: Diagnosis not present

## 2015-11-20 ENCOUNTER — Other Ambulatory Visit: Payer: Self-pay | Admitting: Physician Assistant

## 2015-11-20 DIAGNOSIS — G3184 Mild cognitive impairment, so stated: Secondary | ICD-10-CM | POA: Diagnosis not present

## 2015-11-20 DIAGNOSIS — I10 Essential (primary) hypertension: Secondary | ICD-10-CM | POA: Diagnosis not present

## 2015-11-27 ENCOUNTER — Other Ambulatory Visit: Payer: Self-pay | Admitting: Physician Assistant

## 2015-11-27 DIAGNOSIS — M48062 Spinal stenosis, lumbar region with neurogenic claudication: Secondary | ICD-10-CM | POA: Diagnosis not present

## 2015-11-27 DIAGNOSIS — I10 Essential (primary) hypertension: Secondary | ICD-10-CM | POA: Diagnosis not present

## 2015-12-06 DIAGNOSIS — M5416 Radiculopathy, lumbar region: Secondary | ICD-10-CM | POA: Diagnosis not present

## 2015-12-06 DIAGNOSIS — M545 Low back pain: Secondary | ICD-10-CM | POA: Diagnosis not present

## 2015-12-08 DIAGNOSIS — M5416 Radiculopathy, lumbar region: Secondary | ICD-10-CM | POA: Diagnosis not present

## 2015-12-27 DIAGNOSIS — M545 Low back pain: Secondary | ICD-10-CM | POA: Diagnosis not present

## 2015-12-27 DIAGNOSIS — M5416 Radiculopathy, lumbar region: Secondary | ICD-10-CM | POA: Diagnosis not present

## 2016-01-05 DIAGNOSIS — M5416 Radiculopathy, lumbar region: Secondary | ICD-10-CM | POA: Diagnosis not present

## 2016-01-10 DIAGNOSIS — M5417 Radiculopathy, lumbosacral region: Secondary | ICD-10-CM | POA: Diagnosis not present

## 2016-01-10 DIAGNOSIS — R51 Headache: Secondary | ICD-10-CM | POA: Diagnosis not present

## 2016-01-10 DIAGNOSIS — G3184 Mild cognitive impairment, so stated: Secondary | ICD-10-CM | POA: Diagnosis not present

## 2016-01-23 ENCOUNTER — Other Ambulatory Visit: Payer: Self-pay | Admitting: Physician Assistant

## 2016-01-31 ENCOUNTER — Other Ambulatory Visit: Payer: Self-pay | Admitting: Physician Assistant

## 2016-02-06 ENCOUNTER — Ambulatory Visit (INDEPENDENT_AMBULATORY_CARE_PROVIDER_SITE_OTHER): Payer: Medicare Other | Admitting: Physician Assistant

## 2016-02-06 ENCOUNTER — Encounter: Payer: Self-pay | Admitting: Physician Assistant

## 2016-02-06 VITALS — BP 119/71 | HR 70 | Ht 66.25 in | Wt 185.0 lb

## 2016-02-06 DIAGNOSIS — G3184 Mild cognitive impairment, so stated: Secondary | ICD-10-CM

## 2016-02-06 DIAGNOSIS — G43009 Migraine without aura, not intractable, without status migrainosus: Secondary | ICD-10-CM | POA: Diagnosis not present

## 2016-02-06 DIAGNOSIS — Z23 Encounter for immunization: Secondary | ICD-10-CM | POA: Diagnosis not present

## 2016-02-06 DIAGNOSIS — Z639 Problem related to primary support group, unspecified: Secondary | ICD-10-CM | POA: Diagnosis not present

## 2016-02-06 DIAGNOSIS — R4589 Other symptoms and signs involving emotional state: Secondary | ICD-10-CM | POA: Diagnosis not present

## 2016-02-06 MED ORDER — SERTRALINE HCL 100 MG PO TABS: 100.0000 mg | ORAL_TABLET | Freq: Every day | ORAL | 2 refills | Status: DC

## 2016-02-06 MED ORDER — SUMATRIPTAN SUCCINATE 100 MG PO TABS: 100.0000 mg | ORAL_TABLET | ORAL | 0 refills | Status: DC | PRN

## 2016-02-06 NOTE — Progress Notes (Signed)
   Subjective:    Patient ID: Anthony Davila, male    DOB: 03-Dec-1940, 76 y.o.   MRN: [id number]  HPI Pt is a 76 yo male who presents to the clinic with multiple concerns. He is accompanied by his ex wife.   He has some mild cognitive impairment and on aricept. He is having some problems with his daughter who he lives with. She is verbally very strong with him when he forgets things or does something wrong. This gets him very upset and then he feels like he does more things wrong. His memory deficit is also causing him to bring up feelings of guilt in his past. He states "I did a lot of bad things as a young guy". He feels like he deserves all the bad things now because he was so bad.   He continues to have right sided headaches that start at the top of his head and radiate into/behind right eye. Denies any aura or vision changes. He is sensitive to light. Stress seems to make worse. He usually has 3 or so a week. NSAIDs are not helping.    Review of Systems  All other systems reviewed and are negative.      Objective:   Physical Exam  Constitutional: He is oriented to person, place, and time. He appears well-developed and well-nourished.  HENT:  Head: Normocephalic and atraumatic.  Cardiovascular: Normal rate, regular rhythm and normal heart sounds.   Pulmonary/Chest: Effort normal and breath sounds normal.  Neurological: He is alert and oriented to person, place, and time.  Psychiatric: His behavior is normal.  Flat affect.           Assessment & Plan:  Marland KitchenMarland KitchenDiagnoses and all orders for this visit:  Mild cognitive impairment -     sertraline (ZOLOFT) 100 MG tablet; Take 1 tablet (100 mg total) by mouth daily. -     Ambulatory referral to Psychology  Influenza vaccine needed -     Flu Vaccine QUAD 36+ mos PF IM (Fluarix & Fluzone Quad PF)  Migraine without aura and without status migrainosus, not intractable -     SUMAtriptan (IMITREX) 100 MG tablet; Take 1 tablet (100 mg  total) by mouth every 2 (two) hours as needed for migraine. May repeat in 2 hours if headache persists or recurs.  Family dynamics problem -     sertraline (ZOLOFT) 100 MG tablet; Take 1 tablet (100 mg total) by mouth daily. -     Ambulatory referral to Psychology  Problems of guilt -     sertraline (ZOLOFT) 100 MG tablet; Take 1 tablet (100 mg total) by mouth daily. -     Ambulatory referral to Psychology   Continue with neurology for management of mild cognitive impairment.   Increased zoloft to 100mg  daily to see if helps with mood and allows to hand stress better.  Referred to counseling he has some family issues I think he needs to sort through as well as some guilt of his younger life.   immitrex given for rescue of migraines. Discuss preventative with neurologist. Hopefully reducing stress could reduce migraines.   Follow up in 4 weeks.

## 2016-02-22 ENCOUNTER — Other Ambulatory Visit: Payer: Self-pay | Admitting: Physician Assistant

## 2016-02-28 DIAGNOSIS — H43393 Other vitreous opacities, bilateral: Secondary | ICD-10-CM | POA: Diagnosis not present

## 2016-02-28 DIAGNOSIS — H52223 Regular astigmatism, bilateral: Secondary | ICD-10-CM | POA: Diagnosis not present

## 2016-02-28 DIAGNOSIS — H2513 Age-related nuclear cataract, bilateral: Secondary | ICD-10-CM | POA: Diagnosis not present

## 2016-03-01 ENCOUNTER — Other Ambulatory Visit: Payer: Self-pay | Admitting: Physician Assistant

## 2016-03-01 DIAGNOSIS — G43009 Migraine without aura, not intractable, without status migrainosus: Secondary | ICD-10-CM

## 2016-03-13 ENCOUNTER — Other Ambulatory Visit: Payer: Self-pay | Admitting: Physician Assistant

## 2016-03-14 ENCOUNTER — Other Ambulatory Visit: Payer: Self-pay | Admitting: *Deleted

## 2016-03-14 MED ORDER — ATORVASTATIN CALCIUM 20 MG PO TABS: 20.0000 mg | ORAL_TABLET | Freq: Every day | ORAL | 1 refills | Status: DC

## 2016-03-14 MED ORDER — LOSARTAN POTASSIUM-HCTZ 100-12.5 MG PO TABS: 1.0000 | ORAL_TABLET | Freq: Every day | ORAL | 1 refills | Status: DC

## 2016-03-15 ENCOUNTER — Other Ambulatory Visit: Payer: Self-pay | Admitting: Physician Assistant

## 2016-04-02 ENCOUNTER — Encounter: Payer: Self-pay | Admitting: Physician Assistant

## 2016-04-02 ENCOUNTER — Ambulatory Visit (INDEPENDENT_AMBULATORY_CARE_PROVIDER_SITE_OTHER): Payer: Medicare Other | Admitting: Physician Assistant

## 2016-04-02 VITALS — BP 141/78 | HR 65 | Ht 66.25 in | Wt 181.0 lb

## 2016-04-02 DIAGNOSIS — R4589 Other symptoms and signs involving emotional state: Secondary | ICD-10-CM | POA: Diagnosis not present

## 2016-04-02 DIAGNOSIS — Z639 Problem related to primary support group, unspecified: Secondary | ICD-10-CM | POA: Diagnosis not present

## 2016-04-02 DIAGNOSIS — I10 Essential (primary) hypertension: Secondary | ICD-10-CM | POA: Diagnosis not present

## 2016-04-02 DIAGNOSIS — E781 Pure hyperglyceridemia: Secondary | ICD-10-CM | POA: Diagnosis not present

## 2016-04-02 DIAGNOSIS — G3184 Mild cognitive impairment, so stated: Secondary | ICD-10-CM | POA: Diagnosis not present

## 2016-04-02 DIAGNOSIS — E7800 Pure hypercholesterolemia, unspecified: Secondary | ICD-10-CM | POA: Diagnosis not present

## 2016-04-02 DIAGNOSIS — F411 Generalized anxiety disorder: Secondary | ICD-10-CM

## 2016-04-02 DIAGNOSIS — Z1211 Encounter for screening for malignant neoplasm of colon: Secondary | ICD-10-CM | POA: Diagnosis not present

## 2016-04-02 MED ORDER — SERTRALINE HCL 100 MG PO TABS: 100.0000 mg | ORAL_TABLET | Freq: Every day | ORAL | 1 refills | Status: DC

## 2016-04-02 MED ORDER — METOPROLOL TARTRATE 25 MG PO TABS: ORAL_TABLET | ORAL | 1 refills | Status: DC

## 2016-04-03 NOTE — Progress Notes (Signed)
   Subjective:    Patient ID: Anthony Davila, male    DOB: [birth year], 76 y.o.   MRN: [id number]  HPI Pt is a 76 yo male who presents to the clinic for follow up.   Pt was having a lot of family issues and guilt issues. He was also finding it difficult to remember things. He is on Aricept for memory. He is seeing a Social worker and home life and mood have seemed to improve. He is doing really well.    Review of Systems  All other systems reviewed and are negative.      Objective:   Physical Exam  Constitutional: He is oriented to person, place, and time. He appears well-developed and well-nourished.  HENT:  Head: Normocephalic and atraumatic.  Cardiovascular: Normal rate, regular rhythm and normal heart sounds.   Pulmonary/Chest: Effort normal and breath sounds normal.  Neurological: He is alert and oriented to person, place, and time.  Psychiatric: He has a normal mood and affect. His behavior is normal.          Assessment & Plan:  Marland KitchenMarland KitchenDiagnoses and all orders for this visit:  Mild cognitive impairment -     sertraline (ZOLOFT) 100 MG tablet; Take 1 tablet (100 mg total) by mouth daily.  Colon cancer screening -     Ambulatory referral to Gastroenterology  Family dynamics problem -     sertraline (ZOLOFT) 100 MG tablet; Take 1 tablet (100 mg total) by mouth daily.  Problems of guilt -     sertraline (ZOLOFT) 100 MG tablet; Take 1 tablet (100 mg total) by mouth daily.  Essential hypertension -     metoprolol tartrate (LOPRESSOR) 25 MG tablet; TAKE ONE-HALF TABLET BY MOUTH TWICE DAILY FOR BLOOD PRESSURE. -     COMPLETE METABOLIC PANEL WITH GFR  Pure hypercholesterolemia -     Lipid panel  Hypertriglyceridemia -     Lipid panel  Generalized anxiety disorder   .Marland Kitchen Depression screen [facility] 2/9 04/02/2016  Decreased Interest 0  Down, Depressed, Hopeless 1  PHQ - 2 Score 1   .. Refilled medications. Seems to be doing much better.  Screening labs ordered.  Follow up  in 6 months. Continue with counseling.

## 2016-04-10 DIAGNOSIS — G3184 Mild cognitive impairment, so stated: Secondary | ICD-10-CM | POA: Diagnosis not present

## 2016-04-10 DIAGNOSIS — I10 Essential (primary) hypertension: Secondary | ICD-10-CM | POA: Diagnosis not present

## 2016-05-02 ENCOUNTER — Telehealth: Payer: Self-pay | Admitting: Physician Assistant

## 2016-05-02 NOTE — Telephone Encounter (Signed)
Can we please type up a letter to Dr. Arma Heading Neurologist and fax to his office regarding patient. His family came in for visit because they were concerned with his recent sudden changes in mental status.   See below portion copied from note:  Anthony Davila a 76 yo male. His daughter and exwife come in to the clinic and are very concerned about his well being. Pt has mild cognitive impairment and on aricept. Over the past few month his mood has worsened, he has become more forgetful, he is agitated, and making impulsive decisions. They describe episodes where he has swerved and pulled out in front of people. He will pay for a group of peoples dinner even though he does not have the money. He will not remember conversations that he has. He often does not make sense when trying to talk to him. He angers easily. It is very concerning to the family that he is driving and they want to know the next step in treatment.   Just wanted to give you and FYI. I instructed to make appt with you to consider medication changes.

## 2016-05-16 ENCOUNTER — Other Ambulatory Visit: Payer: Self-pay | Admitting: Physician Assistant

## 2016-05-20 DIAGNOSIS — M79609 Pain in unspecified limb: Secondary | ICD-10-CM | POA: Diagnosis not present

## 2016-05-20 DIAGNOSIS — R202 Paresthesia of skin: Secondary | ICD-10-CM | POA: Diagnosis not present

## 2016-05-22 ENCOUNTER — Other Ambulatory Visit: Payer: Self-pay | Admitting: Physician Assistant

## 2016-05-24 ENCOUNTER — Encounter: Payer: Self-pay | Admitting: Physician Assistant

## 2016-05-24 ENCOUNTER — Ambulatory Visit (INDEPENDENT_AMBULATORY_CARE_PROVIDER_SITE_OTHER): Payer: Medicare Other | Admitting: Physician Assistant

## 2016-05-24 VITALS — BP 133/74 | HR 73 | Ht 66.25 in | Wt 171.0 lb

## 2016-05-24 DIAGNOSIS — G43009 Migraine without aura, not intractable, without status migrainosus: Secondary | ICD-10-CM | POA: Diagnosis not present

## 2016-05-24 DIAGNOSIS — G3184 Mild cognitive impairment, so stated: Secondary | ICD-10-CM | POA: Diagnosis not present

## 2016-05-24 DIAGNOSIS — R5383 Other fatigue: Secondary | ICD-10-CM | POA: Diagnosis not present

## 2016-05-24 DIAGNOSIS — G629 Polyneuropathy, unspecified: Secondary | ICD-10-CM | POA: Diagnosis not present

## 2016-05-24 DIAGNOSIS — M5412 Radiculopathy, cervical region: Secondary | ICD-10-CM | POA: Diagnosis not present

## 2016-05-24 MED ORDER — SUMATRIPTAN SUCCINATE 100 MG PO TABS: ORAL_TABLET | ORAL | 5 refills | Status: DC

## 2016-05-24 MED ORDER — DICLOFENAC SODIUM 1 % TD GEL: 4.0000 g | Freq: Four times a day (QID) | TRANSDERMAL | 1 refills | Status: DC

## 2016-05-24 MED ORDER — PREDNISONE 20 MG PO TABS: ORAL_TABLET | ORAL | 0 refills | Status: DC

## 2016-05-24 NOTE — Progress Notes (Addendum)
Subjective:    Patient ID: Anthony Davila, male    DOB: 1940-04-23, 76 y.o.   MRN: [id number]  HPI  Pt is a 76 yo male who presents to the clinic with left arm pain, numbness, and tingling. He denies any neck pain. No new or known injury. Has had symptoms before and xray was done last year which showed some narrowing of spinal colomn. On 5/14 went to UC and ordered CT but did not get done due to finances. He stopped gabapentin concerned that neuropathy was coming from that. Left arm numbness runs down arm into all fingers more palm and dorsal side with ring and pinky.   Pt's daughter and exwife are concerned about his mental capacity. He is overwithdrawling from bank, he is not taking medication like he should, he is getting lost a lot, he is getting angry with those who love him, he is not making good decisions in the car.   Review of Systems  All other systems reviewed and are negative.      Objective:   Physical Exam  Constitutional: He is oriented to person, place, and time. He appears well-developed and well-nourished.  HENT:  Head: Normocephalic and atraumatic.  Cardiovascular: Normal rate, regular rhythm and normal heart sounds.   Pulmonary/Chest: Effort normal and breath sounds normal. He has no wheezes.  Musculoskeletal:  Negative spurlings sign.  NROM of bilateral shoulders.  Strength 4/5, left arm.  Hand grip 5/5.  No tenderness over cspine.  Negative hawkins, empty care  Neurological: He is alert and oriented to person, place, and time.  Psychiatric: He has a normal mood and affect. His behavior is normal.          Assessment & Plan:   Marland KitchenMarland KitchenDiagnoses and all orders for this visit:  Mild cognitive impairment with memory loss  Left cervical radiculopathy -     predniSONE (DELTASONE) 20 MG tablet; Take 3 tablets for 3 days, take 2 tablets for 3 days, take 1 tablet for 3 days, take 1/2 tablet for 4 days. -     diclofenac sodium (VOLTAREN) 1 % GEL; Apply 4 g  topically 4 (four) times daily. -     MR CERVICAL SPINE WO CONTRAST; Future -     MR CERVICAL SPINE WO CONTRAST  No energy -     predniSONE (DELTASONE) 20 MG tablet; Take 3 tablets for 3 days, take 2 tablets for 3 days, take 1 tablet for 3 days, take 1/2 tablet for 4 days.  Peripheral polyneuropathy  Migraine without aura and without status migrainosus, not intractable -     SUMAtriptan (IMITREX) 100 MG tablet; TAKE ONE TABLET BY MOUTH EVERY 2 HOURS AS NEEDED FOR  MIGRAINE. MAY REPEAT IN 2 HOURS IF HEADACHE PERSISTS OR RECURS   For memory changes and cognitive changes need for follow up with neurologist. I will submit to [facility] that patient does not need to be driving.   For radiculopathy(appears C7 and C8 based on distribution) will start prednisone and flexeril. Will get MRI to futher investigate. Discussed with patient gabapentin was likely helping these symptoms and by going off of it they are worsening. Pt declined going back on it today.  Encouraged to use voltaren gel as needed over cspine and left shoulder. Needs PT but not encough finances at this time. HO given. Follow up in 2 weeks.   Migraine medication refilled.   Spent 30 minutes with patient and greater than 50 percent of visit spent counseling the  patient on need for help for some activity of dialy living.

## 2016-05-30 DIAGNOSIS — Z08 Encounter for follow-up examination after completed treatment for malignant neoplasm: Secondary | ICD-10-CM | POA: Diagnosis not present

## 2016-05-30 DIAGNOSIS — Z85828 Personal history of other malignant neoplasm of skin: Secondary | ICD-10-CM | POA: Diagnosis not present

## 2016-05-30 DIAGNOSIS — L219 Seborrheic dermatitis, unspecified: Secondary | ICD-10-CM | POA: Diagnosis not present

## 2016-05-30 DIAGNOSIS — L570 Actinic keratosis: Secondary | ICD-10-CM | POA: Diagnosis not present

## 2016-06-04 ENCOUNTER — Ambulatory Visit (INDEPENDENT_AMBULATORY_CARE_PROVIDER_SITE_OTHER): Payer: Medicare Other

## 2016-06-04 DIAGNOSIS — M542 Cervicalgia: Secondary | ICD-10-CM | POA: Diagnosis not present

## 2016-06-04 DIAGNOSIS — M4802 Spinal stenosis, cervical region: Secondary | ICD-10-CM

## 2016-06-05 ENCOUNTER — Encounter: Payer: Self-pay | Admitting: Physician Assistant

## 2016-06-05 DIAGNOSIS — M5030 Other cervical disc degeneration, unspecified cervical region: Secondary | ICD-10-CM | POA: Insufficient documentation

## 2016-06-05 DIAGNOSIS — M4712 Other spondylosis with myelopathy, cervical region: Secondary | ICD-10-CM | POA: Insufficient documentation

## 2016-06-05 DIAGNOSIS — M4722 Other spondylosis with radiculopathy, cervical region: Secondary | ICD-10-CM

## 2016-06-05 NOTE — Progress Notes (Signed)
Call pt: MRI shows narrowing of the cervical spine and some cord compression. I would like for you to See Dr. Darene Lamer as soon as possible. Did symptoms improve with steroid?

## 2016-06-06 ENCOUNTER — Other Ambulatory Visit: Payer: Self-pay | Admitting: Physician Assistant

## 2016-06-06 DIAGNOSIS — M5412 Radiculopathy, cervical region: Secondary | ICD-10-CM

## 2016-06-06 DIAGNOSIS — R5383 Other fatigue: Secondary | ICD-10-CM

## 2016-06-11 ENCOUNTER — Encounter: Payer: Self-pay | Admitting: Sports Medicine

## 2016-06-11 ENCOUNTER — Ambulatory Visit (INDEPENDENT_AMBULATORY_CARE_PROVIDER_SITE_OTHER): Payer: Medicare Other | Admitting: Sports Medicine

## 2016-06-11 DIAGNOSIS — M5030 Other cervical disc degeneration, unspecified cervical region: Secondary | ICD-10-CM

## 2016-06-11 MED ORDER — PREDNISONE 50 MG PO TABS: ORAL_TABLET | ORAL | 0 refills | Status: DC

## 2016-06-11 MED ORDER — MELOXICAM 15 MG PO TABS: ORAL_TABLET | ORAL | 3 refills | Status: DC

## 2016-06-11 NOTE — Assessment & Plan Note (Signed)
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 Home rehabilitation exercises given, patient will not logistically be able to get formal physical therapy. Adding another burst of prednisone and meloxicam. Return in one month, if persistent symptoms we will proceed with an epidural.

## 2016-06-11 NOTE — Progress Notes (Signed)
   Subjective:    I'm seeing this patient as a consultation for:  Iran Planas PA-C  CC: neck pain and left arm numbness  HPI: [name] is a 76 y.o. Male with a history of multi-level degenerative disc disease who presents with neck pain and left arm numbness. He says he has had arthritis in his neck for several years, but he started having numbness radiating down his arm around a month ago. The numbness goes from his neck, down the back of his arm, and into all five of his left fingers. He also complains of popping in his neck when he turns it, which he says is embarrassing when he is in church.   He has never had physical therapy for his neck. He was on meloxicam in the past, but he was unable to refill the medication the last time, so he was wondering if he could restart that again as it did provide him pain relief. Cervical spine MRI last week showed multilevel degenerative disc disease from C2 to T1, as well as central canal stenosis and multilevel facet arthropathy.  Past medical history:  Negative.  See flowsheet/record as well for more information.  Surgical history: Negative.  See flowsheet/record as well for more information.  Family history: Negative.  See flowsheet/record as well for more information.  Social history: Negative.  See flowsheet/record as well for more information.  Allergies, and medications have been entered into the medical record, reviewed, and no changes needed.   Review of Systems: No headache, visual changes, nausea, vomiting, diarrhea, constipation, dizziness, abdominal pain, skin rash, fevers, chills, night sweats, weight loss, swollen lymph nodes, body aches, joint swelling, muscle aches, chest pain, shortness of breath, mood changes, visual or auditory hallucinations.   Objective:   General: Well Developed, well nourished, and in no acute distress.  Neuro/Psych: Alert and oriented x3, extra-ocular muscles intact, able to move all 4 extremities, sensation  grossly intact. Skin: Warm and dry, no rashes noted.  Respiratory: Not using accessory muscles, speaking in full sentences, trachea midline.  Cardiovascular: Pulses palpable, no extremity edema. Abdomen: Does not appear distended. Neck: Inspection unremarkable. ROM: neck flexion normal, extension 30 degrees and painful endpoint, left and right head turn painful endpoint, lateral bending painful endpoint. Strength good C4 to T1 distribution Left arm numbness in C6, C7, C8 distribution, right arm sensation normal. Negative Hoffman sign bilaterally Reflexes 1+ bilaterally.    Impression and Recommendations:   This case required medical decision making of moderate complexity.  Anthony Davila is a 76 y.o. Male with a history of multi-level degenerative disc disease, central canal stenosis, multilevel facet arthropathy who presents with left cervical radiculopathy involving C6, C7, C8.  Restarted meloxicam. Prescribed burst prednisone. Pt does not have reliable transportation, so he does not think he would be able to make it consistently to formal physical therapy. Pt was provided with home rehabilitation exercises instead. Follow-up in one month.

## 2016-06-14 ENCOUNTER — Other Ambulatory Visit: Payer: Self-pay | Admitting: Physician Assistant

## 2016-07-09 ENCOUNTER — Ambulatory Visit: Payer: Medicare Other | Admitting: Sports Medicine

## 2016-07-11 ENCOUNTER — Other Ambulatory Visit: Payer: Self-pay | Admitting: Physician Assistant

## 2016-07-29 ENCOUNTER — Other Ambulatory Visit: Payer: Self-pay

## 2016-09-09 ENCOUNTER — Other Ambulatory Visit: Payer: Self-pay | Admitting: Physician Assistant

## 2016-09-10 NOTE — Telephone Encounter (Signed)
Not had appt in a while. Lets see how he is doing?

## 2016-09-12 NOTE — Telephone Encounter (Signed)
Left a message for patient to call back to schedule a follow up appointment.

## 2016-09-16 ENCOUNTER — Other Ambulatory Visit: Payer: Self-pay | Admitting: Physician Assistant

## 2016-09-24 ENCOUNTER — Encounter: Payer: Self-pay | Admitting: Physician Assistant

## 2016-09-24 ENCOUNTER — Ambulatory Visit (INDEPENDENT_AMBULATORY_CARE_PROVIDER_SITE_OTHER): Payer: Medicare Other | Admitting: Physician Assistant

## 2016-09-24 VITALS — BP 130/74 | HR 62 | Ht 66.25 in | Wt 177.0 lb

## 2016-09-24 DIAGNOSIS — G44049 Chronic paroxysmal hemicrania, not intractable: Secondary | ICD-10-CM | POA: Diagnosis not present

## 2016-09-24 DIAGNOSIS — G3184 Mild cognitive impairment, so stated: Secondary | ICD-10-CM | POA: Diagnosis not present

## 2016-09-24 MED ORDER — TIZANIDINE HCL 4 MG PO TABS: 4.0000 mg | ORAL_TABLET | Freq: Three times a day (TID) | ORAL | 1 refills | Status: AC | PRN

## 2016-09-24 MED ORDER — TOPIRAMATE 25 MG PO CPSP: ORAL_CAPSULE | ORAL | 0 refills | Status: DC

## 2016-09-24 NOTE — Progress Notes (Signed)
Subjective:    Patient ID: Anthony Davila, male    DOB: 1940/07/08, 76 y.o.   MRN: [id number]  HPI  Pt is a 76 yo male who presents to the clinic with his exwife to discuss headaches. He has a long hx of headaches but they did improve for a few months but then came back. Pt has mild cognitive impairment but still lives alone and cares for himself. His exwife lives in apartments below him and cooks for him and drives him around. He is taking aricept daily. He has appt with new neurologist Dr. Servando Salina on the 25th of September. Last MRI was normal and in 11/2015.  He describes the headaches at "squeezing 7/10" for last 4 months almost every day. Uses excedrin migraine. Denies any light, sound sensitivity and no n/v. Denies any auras. He did have episode of cervical radicuolpathy it improved signifcantly with prednisone.   .. Active Ambulatory Problems    Diagnosis Date Noted  . Rosacea 02/20/2015  . Essential hypertension 02/20/2015  . Hyperlipidemia 02/20/2015  . OSA on CPAP 02/20/2015  . Left lumbar radiculitis 02/20/2015  . Hypertriglyceridemia 02/20/2015  . Memory loss 02/20/2015  . Vitamin D deficiency 03/07/2015  . Generalized anxiety disorder 03/20/2015  . Neck pain 09/08/2015  . New onset of headaches after age 43 11/03/2015  . Problems of guilt 02/06/2016  . Family dynamics problem 02/06/2016  . Mild cognitive impairment 02/06/2016  . Migraine without aura and without status migrainosus, not intractable 02/06/2016  . DDD (degenerative disc disease), cervical 06/05/2016  . Chronic paroxysmal hemicrania, not intractable 09/24/2016   Resolved Ambulatory Problems    Diagnosis Date Noted  . Cervical spondylosis without myelopathy 09/08/2015  . Cervical spondylosis with myelopathy and radiculopathy 06/05/2016   Past Medical History:  Diagnosis Date  . Essential hypertension 02/20/2015  . Left lumbar radiculitis 02/20/2015  . OSA on CPAP 02/20/2015       Review of Systems   All other systems reviewed and are negative.      Objective:   Physical Exam  Constitutional: He appears well-developed and well-nourished.  Cardiovascular: Normal rate, regular rhythm and normal heart sounds.   Pulmonary/Chest: Effort normal and breath sounds normal.  Musculoskeletal:  Negative tenderness over cspine.  ROM of neck full and without pain.  Tightness over paraspinous muscles of neck and upper back.  Upper ext strength 5/5.   Neurological: He is alert.  Psychiatric: He has a normal mood and affect. His behavior is normal.          Assessment & Plan:  Marland KitchenMarland KitchenDiagnoses and all orders for this visit:  Chronic paroxysmal hemicrania, not intractable -     topiramate (TOPAMAX) 25 MG capsule; Take one tablet at bedtime for 1 week then increase to twice a day for one week then increase to 2 tablets at bedtime for 1 week then increase to 2 tablets twice a day. -     tiZANidine (ZANAFLEX) 4 MG tablet; Take 1 tablet (4 mg total) by mouth every 8 (eight) hours as needed for muscle spasms.  Mild cognitive impairment     .Marland Kitchen MMSE - Mini Mental State Exam 09/24/2016  Orientation to time 5  Orientation to Place 5  Registration 3  Attention/ Calculation 5  Recall 1  Language- name 2 objects 2  Language- repeat 1  Language- follow 3 step command 3  Language- read & follow direction 1  Write a sentence 1  Copy design 1  Total score 28  Mental status exam was great. I still feel like due to reports patient should not be driving. I will attempt to contact DMV.   Headaches sound more tension than migraines. topamax started with titration up. Side effects discussed.  zanaflex as needed for headches and neck tension. Consider taking at bedtime. Certainly cervical DDD could be causing some muscle spasms. Encouraged heat and biofreeze.  Follow up in 3 weeks.

## 2016-09-26 ENCOUNTER — Telehealth: Payer: Self-pay | Admitting: Physician Assistant

## 2016-09-26 NOTE — Telephone Encounter (Signed)
Can we contact DMV and report that above patient is a driving risk?

## 2016-10-01 DIAGNOSIS — G301 Alzheimer's disease with late onset: Secondary | ICD-10-CM | POA: Diagnosis not present

## 2016-10-01 DIAGNOSIS — F0280 Dementia in other diseases classified elsewhere without behavioral disturbance: Secondary | ICD-10-CM | POA: Diagnosis not present

## 2016-10-02 NOTE — Telephone Encounter (Signed)
Called DMV 4 times and was left on hold each time for 15 min longer.this time left a message on DMV vm box

## 2016-10-12 ENCOUNTER — Telehealth: Payer: Self-pay | Admitting: Physician Assistant

## 2016-10-12 NOTE — Telephone Encounter (Signed)
Can we call DMV to get paperwork so that patient should not be allowed to drive?

## 2016-10-13 DIAGNOSIS — I471 Supraventricular tachycardia: Secondary | ICD-10-CM | POA: Diagnosis not present

## 2016-10-13 DIAGNOSIS — R55 Syncope and collapse: Secondary | ICD-10-CM | POA: Diagnosis not present

## 2016-10-13 DIAGNOSIS — J449 Chronic obstructive pulmonary disease, unspecified: Secondary | ICD-10-CM | POA: Diagnosis not present

## 2016-10-13 DIAGNOSIS — I442 Atrioventricular block, complete: Secondary | ICD-10-CM | POA: Diagnosis not present

## 2016-10-13 DIAGNOSIS — Z9989 Dependence on other enabling machines and devices: Secondary | ICD-10-CM | POA: Diagnosis not present

## 2016-10-13 DIAGNOSIS — I10 Essential (primary) hypertension: Secondary | ICD-10-CM | POA: Diagnosis not present

## 2016-10-13 DIAGNOSIS — F419 Anxiety disorder, unspecified: Secondary | ICD-10-CM | POA: Diagnosis not present

## 2016-10-13 DIAGNOSIS — G319 Degenerative disease of nervous system, unspecified: Secondary | ICD-10-CM | POA: Diagnosis not present

## 2016-10-13 DIAGNOSIS — E785 Hyperlipidemia, unspecified: Secondary | ICD-10-CM | POA: Diagnosis not present

## 2016-10-13 DIAGNOSIS — D696 Thrombocytopenia, unspecified: Secondary | ICD-10-CM | POA: Diagnosis not present

## 2016-10-13 DIAGNOSIS — G301 Alzheimer's disease with late onset: Secondary | ICD-10-CM | POA: Diagnosis not present

## 2016-10-13 DIAGNOSIS — E876 Hypokalemia: Secondary | ICD-10-CM | POA: Diagnosis not present

## 2016-10-13 DIAGNOSIS — G4733 Obstructive sleep apnea (adult) (pediatric): Secondary | ICD-10-CM | POA: Diagnosis not present

## 2016-10-13 DIAGNOSIS — F0280 Dementia in other diseases classified elsewhere without behavioral disturbance: Secondary | ICD-10-CM | POA: Diagnosis not present

## 2016-10-13 DIAGNOSIS — D649 Anemia, unspecified: Secondary | ICD-10-CM | POA: Diagnosis not present

## 2016-10-14 DIAGNOSIS — Z8711 Personal history of peptic ulcer disease: Secondary | ICD-10-CM | POA: Diagnosis not present

## 2016-10-14 DIAGNOSIS — R55 Syncope and collapse: Secondary | ICD-10-CM | POA: Diagnosis not present

## 2016-10-14 DIAGNOSIS — F419 Anxiety disorder, unspecified: Secondary | ICD-10-CM | POA: Diagnosis present

## 2016-10-14 DIAGNOSIS — I10 Essential (primary) hypertension: Secondary | ICD-10-CM | POA: Diagnosis not present

## 2016-10-14 DIAGNOSIS — I080 Rheumatic disorders of both mitral and aortic valves: Secondary | ICD-10-CM | POA: Diagnosis not present

## 2016-10-14 DIAGNOSIS — I459 Conduction disorder, unspecified: Secondary | ICD-10-CM | POA: Diagnosis not present

## 2016-10-14 DIAGNOSIS — Z9989 Dependence on other enabling machines and devices: Secondary | ICD-10-CM | POA: Diagnosis not present

## 2016-10-14 DIAGNOSIS — Z79899 Other long term (current) drug therapy: Secondary | ICD-10-CM | POA: Diagnosis not present

## 2016-10-14 DIAGNOSIS — F329 Major depressive disorder, single episode, unspecified: Secondary | ICD-10-CM | POA: Diagnosis present

## 2016-10-14 DIAGNOSIS — I499 Cardiac arrhythmia, unspecified: Secondary | ICD-10-CM | POA: Diagnosis not present

## 2016-10-14 DIAGNOSIS — M48062 Spinal stenosis, lumbar region with neurogenic claudication: Secondary | ICD-10-CM | POA: Diagnosis present

## 2016-10-14 DIAGNOSIS — I442 Atrioventricular block, complete: Secondary | ICD-10-CM | POA: Diagnosis not present

## 2016-10-14 DIAGNOSIS — G4733 Obstructive sleep apnea (adult) (pediatric): Secondary | ICD-10-CM | POA: Diagnosis not present

## 2016-10-14 DIAGNOSIS — J449 Chronic obstructive pulmonary disease, unspecified: Secondary | ICD-10-CM | POA: Diagnosis not present

## 2016-10-14 DIAGNOSIS — Z950 Presence of cardiac pacemaker: Secondary | ICD-10-CM | POA: Diagnosis not present

## 2016-10-14 DIAGNOSIS — I471 Supraventricular tachycardia: Secondary | ICD-10-CM | POA: Diagnosis not present

## 2016-10-14 DIAGNOSIS — G301 Alzheimer's disease with late onset: Secondary | ICD-10-CM | POA: Diagnosis present

## 2016-10-14 DIAGNOSIS — E785 Hyperlipidemia, unspecified: Secondary | ICD-10-CM | POA: Diagnosis not present

## 2016-10-14 DIAGNOSIS — F0280 Dementia in other diseases classified elsewhere without behavioral disturbance: Secondary | ICD-10-CM | POA: Diagnosis present

## 2016-10-14 DIAGNOSIS — Z7982 Long term (current) use of aspirin: Secondary | ICD-10-CM | POA: Diagnosis not present

## 2016-10-14 DIAGNOSIS — M1990 Unspecified osteoarthritis, unspecified site: Secondary | ICD-10-CM | POA: Diagnosis present

## 2016-10-18 ENCOUNTER — Telehealth: Payer: Self-pay

## 2016-10-18 DIAGNOSIS — G4733 Obstructive sleep apnea (adult) (pediatric): Secondary | ICD-10-CM | POA: Diagnosis not present

## 2016-10-18 DIAGNOSIS — Z48812 Encounter for surgical aftercare following surgery on the circulatory system: Secondary | ICD-10-CM | POA: Diagnosis not present

## 2016-10-18 DIAGNOSIS — F0390 Unspecified dementia without behavioral disturbance: Secondary | ICD-10-CM | POA: Diagnosis not present

## 2016-10-18 DIAGNOSIS — Z7982 Long term (current) use of aspirin: Secondary | ICD-10-CM | POA: Diagnosis not present

## 2016-10-18 DIAGNOSIS — Z950 Presence of cardiac pacemaker: Secondary | ICD-10-CM | POA: Diagnosis not present

## 2016-10-18 DIAGNOSIS — J449 Chronic obstructive pulmonary disease, unspecified: Secondary | ICD-10-CM | POA: Diagnosis not present

## 2016-10-18 DIAGNOSIS — I10 Essential (primary) hypertension: Secondary | ICD-10-CM | POA: Diagnosis not present

## 2016-10-18 DIAGNOSIS — F329 Major depressive disorder, single episode, unspecified: Secondary | ICD-10-CM | POA: Diagnosis not present

## 2016-10-18 NOTE — Telephone Encounter (Signed)
Gerald Stabs from [facility] called and stated that patient was released from hospital on 10/15/2016 with a pacemaker  and she wanted to let provider know that home health and physical therapy was already set up for this patient. Rhonda Cunningham,CMA

## 2016-10-18 NOTE — Telephone Encounter (Signed)
Printed and filled out form from [employer] and placed in [place] box to complete

## 2016-10-21 DIAGNOSIS — Z48812 Encounter for surgical aftercare following surgery on the circulatory system: Secondary | ICD-10-CM | POA: Diagnosis not present

## 2016-10-21 DIAGNOSIS — I10 Essential (primary) hypertension: Secondary | ICD-10-CM | POA: Diagnosis not present

## 2016-10-21 DIAGNOSIS — F0390 Unspecified dementia without behavioral disturbance: Secondary | ICD-10-CM | POA: Diagnosis not present

## 2016-10-21 DIAGNOSIS — G4733 Obstructive sleep apnea (adult) (pediatric): Secondary | ICD-10-CM | POA: Diagnosis not present

## 2016-10-21 DIAGNOSIS — F329 Major depressive disorder, single episode, unspecified: Secondary | ICD-10-CM | POA: Diagnosis not present

## 2016-10-21 DIAGNOSIS — J449 Chronic obstructive pulmonary disease, unspecified: Secondary | ICD-10-CM | POA: Diagnosis not present

## 2016-10-22 DIAGNOSIS — J449 Chronic obstructive pulmonary disease, unspecified: Secondary | ICD-10-CM | POA: Diagnosis not present

## 2016-10-22 DIAGNOSIS — I10 Essential (primary) hypertension: Secondary | ICD-10-CM | POA: Diagnosis not present

## 2016-10-22 DIAGNOSIS — F329 Major depressive disorder, single episode, unspecified: Secondary | ICD-10-CM | POA: Diagnosis not present

## 2016-10-22 DIAGNOSIS — F0390 Unspecified dementia without behavioral disturbance: Secondary | ICD-10-CM | POA: Diagnosis not present

## 2016-10-22 DIAGNOSIS — Z48812 Encounter for surgical aftercare following surgery on the circulatory system: Secondary | ICD-10-CM | POA: Diagnosis not present

## 2016-10-22 DIAGNOSIS — G4733 Obstructive sleep apnea (adult) (pediatric): Secondary | ICD-10-CM | POA: Diagnosis not present

## 2016-10-24 DIAGNOSIS — Z48812 Encounter for surgical aftercare following surgery on the circulatory system: Secondary | ICD-10-CM | POA: Diagnosis not present

## 2016-10-24 DIAGNOSIS — I10 Essential (primary) hypertension: Secondary | ICD-10-CM | POA: Diagnosis not present

## 2016-10-24 DIAGNOSIS — J449 Chronic obstructive pulmonary disease, unspecified: Secondary | ICD-10-CM | POA: Diagnosis not present

## 2016-10-24 DIAGNOSIS — F0390 Unspecified dementia without behavioral disturbance: Secondary | ICD-10-CM | POA: Diagnosis not present

## 2016-10-24 DIAGNOSIS — G4733 Obstructive sleep apnea (adult) (pediatric): Secondary | ICD-10-CM | POA: Diagnosis not present

## 2016-10-24 DIAGNOSIS — F329 Major depressive disorder, single episode, unspecified: Secondary | ICD-10-CM | POA: Diagnosis not present

## 2016-10-28 DIAGNOSIS — Z48812 Encounter for surgical aftercare following surgery on the circulatory system: Secondary | ICD-10-CM | POA: Diagnosis not present

## 2016-10-28 DIAGNOSIS — F0390 Unspecified dementia without behavioral disturbance: Secondary | ICD-10-CM | POA: Diagnosis not present

## 2016-10-28 DIAGNOSIS — I10 Essential (primary) hypertension: Secondary | ICD-10-CM | POA: Diagnosis not present

## 2016-10-28 DIAGNOSIS — J449 Chronic obstructive pulmonary disease, unspecified: Secondary | ICD-10-CM | POA: Diagnosis not present

## 2016-10-28 DIAGNOSIS — G4733 Obstructive sleep apnea (adult) (pediatric): Secondary | ICD-10-CM | POA: Diagnosis not present

## 2016-10-28 DIAGNOSIS — F329 Major depressive disorder, single episode, unspecified: Secondary | ICD-10-CM | POA: Diagnosis not present

## 2016-10-29 DIAGNOSIS — J449 Chronic obstructive pulmonary disease, unspecified: Secondary | ICD-10-CM | POA: Diagnosis not present

## 2016-10-29 DIAGNOSIS — I10 Essential (primary) hypertension: Secondary | ICD-10-CM | POA: Diagnosis not present

## 2016-10-29 DIAGNOSIS — F329 Major depressive disorder, single episode, unspecified: Secondary | ICD-10-CM | POA: Diagnosis not present

## 2016-10-29 DIAGNOSIS — F0390 Unspecified dementia without behavioral disturbance: Secondary | ICD-10-CM | POA: Diagnosis not present

## 2016-10-29 DIAGNOSIS — Z48812 Encounter for surgical aftercare following surgery on the circulatory system: Secondary | ICD-10-CM | POA: Diagnosis not present

## 2016-10-29 DIAGNOSIS — G4733 Obstructive sleep apnea (adult) (pediatric): Secondary | ICD-10-CM | POA: Diagnosis not present

## 2016-10-30 ENCOUNTER — Ambulatory Visit (INDEPENDENT_AMBULATORY_CARE_PROVIDER_SITE_OTHER): Payer: Medicare Other | Admitting: Physician Assistant

## 2016-10-30 ENCOUNTER — Encounter: Payer: Self-pay | Admitting: Physician Assistant

## 2016-10-30 VITALS — BP 121/69 | HR 66 | Ht 66.25 in | Wt 177.0 lb

## 2016-10-30 DIAGNOSIS — M5030 Other cervical disc degeneration, unspecified cervical region: Secondary | ICD-10-CM

## 2016-10-30 DIAGNOSIS — R1013 Epigastric pain: Secondary | ICD-10-CM | POA: Diagnosis not present

## 2016-10-30 DIAGNOSIS — I442 Atrioventricular block, complete: Secondary | ICD-10-CM | POA: Diagnosis not present

## 2016-10-30 DIAGNOSIS — Z639 Problem related to primary support group, unspecified: Secondary | ICD-10-CM

## 2016-10-30 DIAGNOSIS — G301 Alzheimer's disease with late onset: Secondary | ICD-10-CM

## 2016-10-30 DIAGNOSIS — R4589 Other symptoms and signs involving emotional state: Secondary | ICD-10-CM | POA: Diagnosis not present

## 2016-10-30 DIAGNOSIS — I10 Essential (primary) hypertension: Secondary | ICD-10-CM | POA: Diagnosis not present

## 2016-10-30 DIAGNOSIS — I459 Conduction disorder, unspecified: Secondary | ICD-10-CM | POA: Diagnosis not present

## 2016-10-30 DIAGNOSIS — E7800 Pure hypercholesterolemia, unspecified: Secondary | ICD-10-CM | POA: Diagnosis not present

## 2016-10-30 DIAGNOSIS — K219 Gastro-esophageal reflux disease without esophagitis: Secondary | ICD-10-CM

## 2016-10-30 DIAGNOSIS — M4712 Other spondylosis with myelopathy, cervical region: Secondary | ICD-10-CM | POA: Diagnosis not present

## 2016-10-30 DIAGNOSIS — F0280 Dementia in other diseases classified elsewhere without behavioral disturbance: Secondary | ICD-10-CM | POA: Diagnosis not present

## 2016-10-30 DIAGNOSIS — M4722 Other spondylosis with radiculopathy, cervical region: Secondary | ICD-10-CM

## 2016-10-30 MED ORDER — MELOXICAM 15 MG PO TABS: ORAL_TABLET | ORAL | 1 refills | Status: DC

## 2016-10-30 MED ORDER — SERTRALINE HCL 100 MG PO TABS: 100.0000 mg | ORAL_TABLET | Freq: Every day | ORAL | 1 refills | Status: DC

## 2016-10-30 MED ORDER — METOPROLOL TARTRATE 25 MG PO TABS: ORAL_TABLET | ORAL | 1 refills | Status: DC

## 2016-10-30 MED ORDER — OMEPRAZOLE 40 MG PO CPDR: 40.0000 mg | DELAYED_RELEASE_CAPSULE | Freq: Every day | ORAL | 3 refills | Status: DC

## 2016-10-30 MED ORDER — ATORVASTATIN CALCIUM 20 MG PO TABS: ORAL_TABLET | ORAL | 1 refills | Status: DC

## 2016-10-30 MED ORDER — LOSARTAN POTASSIUM-HCTZ 100-12.5 MG PO TABS: 1.0000 | ORAL_TABLET | Freq: Every day | ORAL | 1 refills | Status: DC

## 2016-10-30 NOTE — Patient Instructions (Signed)
Topamax STOP.  Start omeprazole.

## 2016-10-30 NOTE — Progress Notes (Signed)
Subjective:    Patient ID: Anthony Davila, male    DOB: 1940/03/31, 76 y.o.   MRN: [id number]  HPI  Pt is a 76 yo pleasant male who presents to the clinic with his ex wife and caregiver.   Pt has recently been dx with late onset alzheimers from mild cognitive decline. He is managed by neurology. He has appt with memory clinic in November. Ex wife feels like disease is progressing fast. She is having to keep apartment clean, manage medications, take to appt, handle all of his business.   On 10/7 patient experienced syncope and dx with CHB. Pt now has a pacemaker and followed by cardiology.   He continues to complain of neck pain and headaches. Recent MRI was done and showed CDDD and some bulging. He has failed PT. Prednisone burst help but not for long. He does take mobic daily. He cannot tolerate muscle relaxers due to making him feel loopy. Continues to report headaches as "band like and squeezing". topamax tried but did not make any difference. Made appt with Dr. Dolly Rias in orthopedics to consider injections.   Pt does complain of some epigastric pain and nausea. Not everyday but off and on. No melena or hematochezia.   Marland Kitchen. Active Ambulatory Problems    Diagnosis Date Noted  . Rosacea 02/20/2015  . Essential hypertension 02/20/2015  . Hyperlipidemia 02/20/2015  . OSA on CPAP 02/20/2015  . Left lumbar radiculitis 02/20/2015  . Hypertriglyceridemia 02/20/2015  . Memory loss 02/20/2015  . Vitamin D deficiency 03/07/2015  . Generalized anxiety disorder 03/20/2015  . Neck pain 09/08/2015  . New onset of headaches after age 3 11/03/2015  . Problems of guilt 02/06/2016  . Family dynamics problem 02/06/2016  . Migraine without aura and without status migrainosus, not intractable 02/06/2016  . DDD (degenerative disc disease), cervical 06/05/2016  . 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 Late onset Alzheimer's disease without behavioral disturbance 11/01/2016  .  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 Complete heart block ([place]) 11/01/2016   Resolved Ambulatory Problems    Diagnosis Date Noted  . Cervical spondylosis without myelopathy 09/08/2015  . Mild cognitive impairment 02/06/2016  . Cervical spondylosis with myelopathy and radiculopathy 06/05/2016   Past Medical History:  Diagnosis Date  . Essential hypertension 02/20/2015  . Left lumbar radiculitis 02/20/2015  . OSA on CPAP 02/20/2015     Review of Systems  All other systems reviewed and are negative.      Objective:   Physical Exam  Constitutional: He appears well-developed and well-nourished.  HENT:  Head: Normocephalic and atraumatic.  Cardiovascular: Normal rate, regular rhythm and normal heart sounds.   Pulmonary/Chest: Effort normal and breath sounds normal. He has no wheezes.  Abdominal: Soft. Bowel sounds are normal. He exhibits no distension and no mass. There is no tenderness. There is no rebound and no guarding.  Neurological: He is alert. No cranial nerve deficit. Coordination normal.  Pt had rambling thoughts throughout the visit.   Psychiatric: He has a normal mood and affect. His behavior is normal.          Assessment & Plan:  Marland KitchenMarland KitchenFinlee was seen today for headache.  Diagnoses and all orders for this visit:  DDD (degenerative disc disease), cervical -     meloxicam (MOBIC) 15 MG tablet; One tab PO qAM with breakfast for 2 weeks, then daily prn pain.  Essential hypertension -     metoprolol tartrate (LOPRESSOR) 25 MG tablet; TAKE ONE-HALF TABLET BY  MOUTH TWICE DAILY FOR BLOOD PRESSURE. -     losartan-hydrochlorothiazide (HYZAAR) 100-12.5 MG tablet; Take 1 tablet by mouth daily.  Family dynamics problem -     sertraline (ZOLOFT) 100 MG tablet; Take 1 tablet (100 mg total) by mouth daily.  Problems of guilt -     sertraline (ZOLOFT) 100 MG tablet; Take 1 tablet (100 mg total) by mouth daily.  Late onset Alzheimer's disease without behavioral  disturbance  Complete heart block ([facility])  Stokes-Adams syncope  Cervical spondylosis with myelopathy and radiculopathy  Pure hypercholesterolemia -     atorvastatin (LIPITOR) 20 MG tablet; TAKE 1 TABLET BY MOUTH ONCE DAILY .  Gastroesophageal reflux disease without esophagitis -     omeprazole (PRILOSEC) 40 MG capsule; Take 1 capsule (40 mg total) by mouth daily.   Patient has been scheduled with orthopedist to consider injections for cervical degenerative disc disease.  Continue with meloxicam but could be causing some gastritis. Take with food and add omeprazole.  Discontinued Topamax and all muscle relaxers.  Continue follow-up with neurologist for late onset Alzheimer's disease.  Patient has appointment with memory clinic in November.  Continue to follow-up with cardiology for pacemaker management.  Vital signs are good today.

## 2016-10-31 DIAGNOSIS — M542 Cervicalgia: Secondary | ICD-10-CM | POA: Diagnosis not present

## 2016-10-31 DIAGNOSIS — M5416 Radiculopathy, lumbar region: Secondary | ICD-10-CM | POA: Diagnosis not present

## 2016-10-31 DIAGNOSIS — M545 Low back pain: Secondary | ICD-10-CM | POA: Diagnosis not present

## 2016-10-31 DIAGNOSIS — M5412 Radiculopathy, cervical region: Secondary | ICD-10-CM | POA: Diagnosis not present

## 2016-11-01 ENCOUNTER — Encounter: Payer: Self-pay | Admitting: Physician Assistant

## 2016-11-01 DIAGNOSIS — K219 Gastro-esophageal reflux disease without esophagitis: Secondary | ICD-10-CM | POA: Insufficient documentation

## 2016-11-01 DIAGNOSIS — I442 Atrioventricular block, complete: Secondary | ICD-10-CM | POA: Insufficient documentation

## 2016-11-01 DIAGNOSIS — G301 Alzheimer's disease with late onset: Secondary | ICD-10-CM

## 2016-11-01 DIAGNOSIS — R1013 Epigastric pain: Secondary | ICD-10-CM | POA: Insufficient documentation

## 2016-11-01 DIAGNOSIS — I459 Conduction disorder, unspecified: Secondary | ICD-10-CM | POA: Insufficient documentation

## 2016-11-01 DIAGNOSIS — F0280 Dementia in other diseases classified elsewhere without behavioral disturbance: Secondary | ICD-10-CM | POA: Insufficient documentation

## 2016-11-07 DIAGNOSIS — M47813 Spondylosis without myelopathy or radiculopathy, cervicothoracic region: Secondary | ICD-10-CM | POA: Diagnosis not present

## 2016-11-07 DIAGNOSIS — M50323 Other cervical disc degeneration at C6-C7 level: Secondary | ICD-10-CM | POA: Diagnosis not present

## 2016-11-07 DIAGNOSIS — M47812 Spondylosis without myelopathy or radiculopathy, cervical region: Secondary | ICD-10-CM | POA: Diagnosis not present

## 2016-11-07 DIAGNOSIS — M4313 Spondylolisthesis, cervicothoracic region: Secondary | ICD-10-CM | POA: Diagnosis not present

## 2016-11-07 DIAGNOSIS — M7138 Other bursal cyst, other site: Secondary | ICD-10-CM | POA: Diagnosis not present

## 2016-11-07 DIAGNOSIS — M5033 Other cervical disc degeneration, cervicothoracic region: Secondary | ICD-10-CM | POA: Diagnosis not present

## 2016-11-07 DIAGNOSIS — M47811 Spondylosis without myelopathy or radiculopathy, occipito-atlanto-axial region: Secondary | ICD-10-CM | POA: Diagnosis not present

## 2016-11-07 DIAGNOSIS — M47892 Other spondylosis, cervical region: Secondary | ICD-10-CM | POA: Diagnosis not present

## 2016-11-14 DIAGNOSIS — M5412 Radiculopathy, cervical region: Secondary | ICD-10-CM | POA: Diagnosis not present

## 2016-11-14 DIAGNOSIS — M542 Cervicalgia: Secondary | ICD-10-CM | POA: Diagnosis not present

## 2016-11-18 DIAGNOSIS — M5412 Radiculopathy, cervical region: Secondary | ICD-10-CM | POA: Diagnosis not present

## 2016-11-27 DIAGNOSIS — Z45018 Encounter for adjustment and management of other part of cardiac pacemaker: Secondary | ICD-10-CM | POA: Diagnosis not present

## 2016-11-27 DIAGNOSIS — Z45010 Encounter for checking and testing of cardiac pacemaker pulse generator [battery]: Secondary | ICD-10-CM | POA: Diagnosis not present

## 2016-12-19 DIAGNOSIS — M5412 Radiculopathy, cervical region: Secondary | ICD-10-CM | POA: Diagnosis not present

## 2016-12-19 DIAGNOSIS — M542 Cervicalgia: Secondary | ICD-10-CM | POA: Diagnosis not present

## 2016-12-19 DIAGNOSIS — M5416 Radiculopathy, lumbar region: Secondary | ICD-10-CM | POA: Diagnosis not present

## 2016-12-24 ENCOUNTER — Encounter: Payer: Self-pay | Admitting: Physician Assistant

## 2016-12-24 ENCOUNTER — Ambulatory Visit (INDEPENDENT_AMBULATORY_CARE_PROVIDER_SITE_OTHER): Payer: Medicare Other | Admitting: Physician Assistant

## 2016-12-24 VITALS — BP 143/76 | HR 63 | Ht 66.25 in | Wt 177.0 lb

## 2016-12-24 DIAGNOSIS — H9313 Tinnitus, bilateral: Secondary | ICD-10-CM

## 2016-12-24 DIAGNOSIS — M5030 Other cervical disc degeneration, unspecified cervical region: Secondary | ICD-10-CM

## 2016-12-24 DIAGNOSIS — G301 Alzheimer's disease with late onset: Secondary | ICD-10-CM

## 2016-12-24 DIAGNOSIS — M549 Dorsalgia, unspecified: Secondary | ICD-10-CM

## 2016-12-24 DIAGNOSIS — F0280 Dementia in other diseases classified elsewhere without behavioral disturbance: Secondary | ICD-10-CM | POA: Diagnosis not present

## 2016-12-24 DIAGNOSIS — G44049 Chronic paroxysmal hemicrania, not intractable: Secondary | ICD-10-CM | POA: Diagnosis not present

## 2016-12-24 NOTE — Progress Notes (Addendum)
Subjective:    Patient ID: Anthony Davila, male    DOB: 1940/06/21, 76 y.o.   MRN: [id number]  HPI Patient is a 76 y/o male with PMH of late Alzheimer's disease, HTN, and chronic headache who presents for follow-up and ringing in his ears.  Headache - Patient continues to endorse headache. He reports receiving injections in his neck that helped a little with his headache. He reports decrease in neck stiffness after the injections. He describes the headache as occurring across his forehead and is a dull ache. It occurs every day, sometimes he wakes up with it. He states that it is not always severe. He takes Meloxicam daily, his wife is monitoring his medications. He has not had relief of his headache with the meloxicam. He is unable to tolerate muscle relaxer because it makes him too drowsy. He has another appointment on 12/26 for lumbar spine injections due to increased pain in his lower back.  Azheimer's disease - Patient reports first memory care appointment tomorrow, was unable to make it sooner because of his back.  Tinnitus - Patient reports that he has had constant tinnitus for a few years, that has worsened slightly over the last 6 months. He states he was told to take fish oil in the past, but denies relief of symptoms. Family reports that he also has difficulty hearing. He endorses rhinorrhea but denies congestion or trial of nasal sprays.  Pt's daughter reports it is harder and harder to take care of the patient now with her working and his ex-wife(current caregiver) not doing good with her health. They would like to consider placement in a home to help with care.   .. Active Ambulatory Problems    Diagnosis Date Noted  . Rosacea 02/20/2015  . Essential hypertension 02/20/2015  . Hyperlipidemia 02/20/2015  . OSA on CPAP 02/20/2015  . Left lumbar radiculitis 02/20/2015  . Hypertriglyceridemia 02/20/2015  . Memory loss 02/20/2015  . Vitamin D deficiency 03/07/2015  . Generalized  anxiety disorder 03/20/2015  . Neck pain 09/08/2015  . New onset of headaches after age 37 11/03/2015  . Problems of guilt 02/06/2016  . Family dynamics problem 02/06/2016  . Migraine without aura and without status migrainosus, not intractable 02/06/2016  . DDD (degenerative disc disease), cervical 06/05/2016  . 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 Late onset Alzheimer's disease without behavioral disturbance 11/01/2016  . 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 Complete heart block ([place]) 11/01/2016  . Epigastric discomfort 11/01/2016  . Gastroesophageal reflux disease without esophagitis 11/01/2016  . Bilateral hearing loss 12/25/2016   Resolved Ambulatory Problems    Diagnosis Date Noted  . Cervical spondylosis without myelopathy 09/08/2015  . Mild cognitive impairment 02/06/2016  . Cervical spondylosis with myelopathy and radiculopathy 06/05/2016   Past Medical History:  Diagnosis Date  . Essential hypertension 02/20/2015  . Left lumbar radiculitis 02/20/2015  . OSA on CPAP 02/20/2015     Review of Systems See HPI, all other systems reviewed are negative.    Objective:   Physical Exam  Constitutional: He is oriented to person, place, and time. He appears well-developed and well-nourished.  HENT:  Head: Normocephalic and atraumatic.  Right Ear: Tympanic membrane, external ear and ear canal normal.  Left Ear: Tympanic membrane, external ear and ear canal normal.  Nose: Nose normal. Right sinus exhibits no maxillary sinus tenderness and no frontal sinus tenderness. Left sinus exhibits no maxillary sinus tenderness and no frontal sinus tenderness.  Mouth/Throat: Uvula is midline,  oropharynx is clear and moist and mucous membranes are normal.  Cardiovascular: Normal rate, regular rhythm and normal heart sounds.  Pulmonary/Chest: Effort normal and breath sounds normal.  Musculoskeletal:  No tenderness to palpation of the spine or cervical paravertebral  muscles. Stiffness of the cervical paravertebral muscles and trapezius on palpation.  Neurological: He is alert and oriented to person, place, and time.  Skin: Skin is warm and dry.  Psychiatric: He has a normal mood and affect. His behavior is normal. Thought content normal.  Vitals reviewed.     Assessment & Plan:  .Marland KitchenMarland KitchenDiagnoses and all orders for this visit:  Tinnitus of both ears  Late onset Alzheimer's disease without behavioral disturbance  DDD (degenerative disc disease), cervical -     Ambulatory referral to Home Health  Chronic paroxysmal hemicrania, not intractable -     Ambulatory referral to [place]  Upper back pain -     Ambulatory referral to [place]   Bilateral tinnitus - Suspect due to age related hearing loss, however it could be a medication side effect. Will try removing HCTZ or Zoloft from regimen as they can both cause tinnitius as a side effect. Because his BP is well controled on with HCTZ, I would prefer to start with Zoloft. Advised patient to half his dose of Zoloft for 3-4 weeks to see if his tinnitus decreases. - Hearing test today to compare to baseline and refer to audiology for formal hearing test and evaluation. Hearing has decreased. Pt concerned about cost and affordability for hearing aids. Will get nurse to look into cheaper options.  - Advised patient to use Flonase intranasally in the morning. -follow up in 1-2 months.   Late onset Alzheimer's disease - Patient has his first memory care appointment tomorrow. His daughter reports that the patients memory and mood are worsening and it is taking a toll on the family. She was questioning assisted living options. Advised her to discuss this at his appointment tomorrow and we can further evaluate it at his follow-up if they are not able to provider them with assistance.  Cervical DDD - Continue to follow-up with ortho for cervical injections. - Encouraged conservative treatment to relieve  tense muscles such as massage and tens unit. -I will set patient up for home PT to see if this can give him further relief.   Chronic paroxysmal hemicrania - Patient has had some relief of his headache with cervical spine injections. Referall for in home PT for muscle tension of his neck and trapezius in hopes that it will provide some relief of his headache.  Marland KitchenMarland KitchenMarland KitchenSpent 30 minutes with patient and greater than 50 percent of visit spent counseling patient regarding treatment plan.

## 2016-12-24 NOTE — Patient Instructions (Addendum)
flonase 2 sprays each nostril.  Cut back to sertaline 50mg  daily to see if helps with ringing in ears.   Tinnitus Tinnitus refers to hearing a sound when there is no actual source for that sound. This is often described as ringing in the ears. However, people with this condition may hear a variety of noises. A person may hear the sound in one ear or in both ears. The sounds of tinnitus can be soft, loud, or somewhere in between. Tinnitus can last for a few seconds or can be constant for days. It may go away without treatment and come back at various times. When tinnitus is constant or happens often, it can lead to other problems, such as trouble sleeping and trouble concentrating. Almost everyone experiences tinnitus at some point. Tinnitus that is long-lasting (chronic) or comes back often is a problem that may require medical attention. What are the causes? The cause of tinnitus is often not known. In some cases, it can result from other problems or conditions, including:  Exposure to loud noises from machinery, music, or other sources.  Hearing loss.  Ear or sinus infections.  Earwax buildup.  A foreign object in the ear.  Use of certain medicines.  Use of alcohol and caffeine.  High blood pressure.  Heart diseases.  Anemia.  Allergies.  Meniere disease.  Thyroid problems.  Tumors.  An enlarged part of a weakened blood vessel (aneurysm).  What are the signs or symptoms? The main symptom of tinnitus is hearing a sound when there is no source for that sound. It may sound like:  Buzzing.  Roaring.  Ringing.  Blowing air, similar to the sound heard when you listen to a seashell.  Hissing.  Whistling.  Sizzling.  Humming.  Running water.  A sustained musical note.  How is this diagnosed? Tinnitus is diagnosed based on your symptoms. Your health care provider will do a physical exam. A comprehensive hearing exam (audiologic exam) will be done if your  tinnitus:  Affects only one ear (unilateral).  Causes hearing difficulties.  Lasts 6 months or longer.  You may also need to see a health care provider who specializes in hearing disorders (audiologist). You may be asked to complete a questionnaire to determine the severity of your tinnitus. Tests may be done to help determine the cause and to rule out other conditions. These can include:  Imaging studies of your head and brain, such as: ? A CT scan. ? An MRI.  An imaging study of your blood vessels (angiogram).  How is this treated? Treating an underlying medical condition can sometimes make tinnitus go away. If your tinnitus continues, other treatments may include:  Medicines, such as certain antidepressants or sleeping aids.  Sound generators to mask the tinnitus. These include: ? Tabletop sound machines that play relaxing sounds to help you fall asleep. ? Wearable devices that fit in your ear and play sounds or music. ? A small device that uses headphones to deliver a signal embedded in music (acoustic neural stimulation). In time, this may change the pathways of your brain and make you less sensitive to tinnitus. This device is used for very severe cases when no other treatment is working.  Therapy and counseling to help you manage the stress of living with tinnitus.  Using hearing aids or cochlear implants, if your tinnitus is related to hearing loss.  Follow these instructions at home:  When possible, avoid being in loud places and being exposed to loud sounds.  Wear hearing protection, such as earplugs, when you are exposed to loud noises.  Do not take stimulants, such as nicotine, alcohol, or caffeine.  Practice techniques for reducing stress, such as meditation, yoga, or deep breathing.  Use a Reich noise machine, a humidifier, or other devices to mask the sound of tinnitus.  Sleep with your head slightly raised. This may reduce the impact of tinnitus.  Try to  get plenty of rest each night. Contact a health care provider if:  You have tinnitus in just one ear.  Your tinnitus continues for 3 weeks or longer without stopping.  Home care measures are not helping.  You have tinnitus after a head injury.  You have tinnitus along with any of the following: ? Dizziness. ? Loss of balance. ? Nausea and vomiting. This information is not intended to replace advice given to you by your health care provider. Make sure you discuss any questions you have with your health care provider. Document Released: 12/24/2004 Document Revised: 08/27/2015 Document Reviewed: 05/26/2013 Elsevier Interactive Patient Education  2018 [employer].

## 2016-12-24 NOTE — Progress Notes (Deleted)
   Subjective:    Patient ID: Anthony Davila, male    DOB: 1940-08-02, 75 y.o.   MRN: [id number]  HPI  Anthony Davila.  Medicare-hearing aids.   [facility] his not recall.     Review of Systems     Objective:   Physical Exam        Assessment & Plan:

## 2016-12-25 ENCOUNTER — Encounter: Payer: Self-pay | Admitting: Physician Assistant

## 2016-12-25 ENCOUNTER — Telehealth: Payer: Self-pay | Admitting: Physician Assistant

## 2016-12-25 DIAGNOSIS — F329 Major depressive disorder, single episode, unspecified: Secondary | ICD-10-CM | POA: Diagnosis not present

## 2016-12-25 DIAGNOSIS — I10 Essential (primary) hypertension: Secondary | ICD-10-CM | POA: Diagnosis not present

## 2016-12-25 DIAGNOSIS — G301 Alzheimer's disease with late onset: Secondary | ICD-10-CM | POA: Diagnosis not present

## 2016-12-25 DIAGNOSIS — H903 Sensorineural hearing loss, bilateral: Secondary | ICD-10-CM

## 2016-12-25 DIAGNOSIS — F0280 Dementia in other diseases classified elsewhere without behavioral disturbance: Secondary | ICD-10-CM | POA: Diagnosis not present

## 2016-12-25 DIAGNOSIS — H9193 Unspecified hearing loss, bilateral: Secondary | ICD-10-CM | POA: Insufficient documentation

## 2016-12-25 NOTE — Telephone Encounter (Signed)
Do we have any information on cheap places that will do medicare hearing aids? Patient would like to know estimated cost. Please talk to Jeral Fruit, daughter with information.

## 2016-12-27 NOTE — Telephone Encounter (Signed)
unsuccessful in finding a discounted rate. Every where advised me to contact the health plan for recommendations. Pt's daughter advised of this. She will call them. No further questions.

## 2017-01-01 DIAGNOSIS — M5416 Radiculopathy, lumbar region: Secondary | ICD-10-CM | POA: Diagnosis not present

## 2017-01-17 DIAGNOSIS — M5030 Other cervical disc degeneration, unspecified cervical region: Secondary | ICD-10-CM | POA: Diagnosis not present

## 2017-01-17 DIAGNOSIS — F0280 Dementia in other diseases classified elsewhere without behavioral disturbance: Secondary | ICD-10-CM | POA: Diagnosis not present

## 2017-01-17 DIAGNOSIS — G40909 Epilepsy, unspecified, not intractable, without status epilepticus: Secondary | ICD-10-CM | POA: Diagnosis not present

## 2017-01-17 DIAGNOSIS — L719 Rosacea, unspecified: Secondary | ICD-10-CM | POA: Diagnosis not present

## 2017-01-17 DIAGNOSIS — E785 Hyperlipidemia, unspecified: Secondary | ICD-10-CM | POA: Diagnosis not present

## 2017-01-17 DIAGNOSIS — G4733 Obstructive sleep apnea (adult) (pediatric): Secondary | ICD-10-CM | POA: Diagnosis not present

## 2017-01-17 DIAGNOSIS — Z9181 History of falling: Secondary | ICD-10-CM | POA: Diagnosis not present

## 2017-01-17 DIAGNOSIS — F411 Generalized anxiety disorder: Secondary | ICD-10-CM | POA: Diagnosis not present

## 2017-01-17 DIAGNOSIS — M545 Low back pain: Secondary | ICD-10-CM | POA: Diagnosis not present

## 2017-01-17 DIAGNOSIS — M47812 Spondylosis without myelopathy or radiculopathy, cervical region: Secondary | ICD-10-CM | POA: Diagnosis not present

## 2017-01-17 DIAGNOSIS — K219 Gastro-esophageal reflux disease without esophagitis: Secondary | ICD-10-CM | POA: Diagnosis not present

## 2017-01-17 DIAGNOSIS — H9313 Tinnitus, bilateral: Secondary | ICD-10-CM | POA: Diagnosis not present

## 2017-01-17 DIAGNOSIS — G301 Alzheimer's disease with late onset: Secondary | ICD-10-CM | POA: Diagnosis not present

## 2017-01-17 DIAGNOSIS — Z7982 Long term (current) use of aspirin: Secondary | ICD-10-CM | POA: Diagnosis not present

## 2017-01-17 DIAGNOSIS — I10 Essential (primary) hypertension: Secondary | ICD-10-CM | POA: Diagnosis not present

## 2017-01-22 ENCOUNTER — Other Ambulatory Visit: Payer: Self-pay | Admitting: Physician Assistant

## 2017-01-22 DIAGNOSIS — F0280 Dementia in other diseases classified elsewhere without behavioral disturbance: Secondary | ICD-10-CM | POA: Diagnosis not present

## 2017-01-22 DIAGNOSIS — G301 Alzheimer's disease with late onset: Secondary | ICD-10-CM | POA: Diagnosis not present

## 2017-01-22 DIAGNOSIS — M47812 Spondylosis without myelopathy or radiculopathy, cervical region: Secondary | ICD-10-CM | POA: Diagnosis not present

## 2017-01-22 DIAGNOSIS — I10 Essential (primary) hypertension: Secondary | ICD-10-CM

## 2017-01-22 DIAGNOSIS — M5030 Other cervical disc degeneration, unspecified cervical region: Secondary | ICD-10-CM | POA: Diagnosis not present

## 2017-01-22 DIAGNOSIS — M545 Low back pain: Secondary | ICD-10-CM | POA: Diagnosis not present

## 2017-01-25 DIAGNOSIS — Z8679 Personal history of other diseases of the circulatory system: Secondary | ICD-10-CM | POA: Diagnosis not present

## 2017-01-25 DIAGNOSIS — G301 Alzheimer's disease with late onset: Secondary | ICD-10-CM | POA: Diagnosis not present

## 2017-01-25 DIAGNOSIS — I479 Paroxysmal tachycardia, unspecified: Secondary | ICD-10-CM | POA: Diagnosis not present

## 2017-01-25 DIAGNOSIS — R079 Chest pain, unspecified: Secondary | ICD-10-CM | POA: Diagnosis not present

## 2017-01-25 DIAGNOSIS — Z79899 Other long term (current) drug therapy: Secondary | ICD-10-CM | POA: Diagnosis not present

## 2017-01-25 DIAGNOSIS — E785 Hyperlipidemia, unspecified: Secondary | ICD-10-CM | POA: Diagnosis not present

## 2017-01-25 DIAGNOSIS — M47812 Spondylosis without myelopathy or radiculopathy, cervical region: Secondary | ICD-10-CM | POA: Diagnosis not present

## 2017-01-25 DIAGNOSIS — Z87891 Personal history of nicotine dependence: Secondary | ICD-10-CM | POA: Diagnosis not present

## 2017-01-25 DIAGNOSIS — I10 Essential (primary) hypertension: Secondary | ICD-10-CM | POA: Diagnosis not present

## 2017-01-25 DIAGNOSIS — Z9989 Dependence on other enabling machines and devices: Secondary | ICD-10-CM | POA: Diagnosis not present

## 2017-01-25 DIAGNOSIS — M5030 Other cervical disc degeneration, unspecified cervical region: Secondary | ICD-10-CM | POA: Diagnosis not present

## 2017-01-25 DIAGNOSIS — Z950 Presence of cardiac pacemaker: Secondary | ICD-10-CM | POA: Diagnosis not present

## 2017-01-25 DIAGNOSIS — R42 Dizziness and giddiness: Secondary | ICD-10-CM | POA: Diagnosis not present

## 2017-01-25 DIAGNOSIS — F0280 Dementia in other diseases classified elsewhere without behavioral disturbance: Secondary | ICD-10-CM | POA: Diagnosis not present

## 2017-01-25 DIAGNOSIS — Z888 Allergy status to other drugs, medicaments and biological substances status: Secondary | ICD-10-CM | POA: Diagnosis not present

## 2017-01-25 DIAGNOSIS — M545 Low back pain: Secondary | ICD-10-CM | POA: Diagnosis not present

## 2017-01-25 DIAGNOSIS — Z791 Long term (current) use of non-steroidal anti-inflammatories (NSAID): Secondary | ICD-10-CM | POA: Diagnosis not present

## 2017-01-25 DIAGNOSIS — G4733 Obstructive sleep apnea (adult) (pediatric): Secondary | ICD-10-CM | POA: Diagnosis not present

## 2017-01-25 DIAGNOSIS — Z7982 Long term (current) use of aspirin: Secondary | ICD-10-CM | POA: Diagnosis not present

## 2017-01-25 DIAGNOSIS — E875 Hyperkalemia: Secondary | ICD-10-CM | POA: Diagnosis not present

## 2017-01-28 DIAGNOSIS — F0280 Dementia in other diseases classified elsewhere without behavioral disturbance: Secondary | ICD-10-CM | POA: Diagnosis not present

## 2017-01-28 DIAGNOSIS — M5030 Other cervical disc degeneration, unspecified cervical region: Secondary | ICD-10-CM | POA: Diagnosis not present

## 2017-01-28 DIAGNOSIS — M545 Low back pain: Secondary | ICD-10-CM | POA: Diagnosis not present

## 2017-01-28 DIAGNOSIS — G301 Alzheimer's disease with late onset: Secondary | ICD-10-CM | POA: Diagnosis not present

## 2017-01-28 DIAGNOSIS — M47812 Spondylosis without myelopathy or radiculopathy, cervical region: Secondary | ICD-10-CM | POA: Diagnosis not present

## 2017-01-28 DIAGNOSIS — I10 Essential (primary) hypertension: Secondary | ICD-10-CM | POA: Diagnosis not present

## 2017-01-30 DIAGNOSIS — I10 Essential (primary) hypertension: Secondary | ICD-10-CM | POA: Diagnosis not present

## 2017-01-30 DIAGNOSIS — R0789 Other chest pain: Secondary | ICD-10-CM | POA: Diagnosis not present

## 2017-01-30 DIAGNOSIS — I471 Supraventricular tachycardia: Secondary | ICD-10-CM | POA: Diagnosis not present

## 2017-01-30 DIAGNOSIS — I442 Atrioventricular block, complete: Secondary | ICD-10-CM | POA: Diagnosis not present

## 2017-01-31 DIAGNOSIS — M5030 Other cervical disc degeneration, unspecified cervical region: Secondary | ICD-10-CM | POA: Diagnosis not present

## 2017-01-31 DIAGNOSIS — G301 Alzheimer's disease with late onset: Secondary | ICD-10-CM | POA: Diagnosis not present

## 2017-01-31 DIAGNOSIS — F0280 Dementia in other diseases classified elsewhere without behavioral disturbance: Secondary | ICD-10-CM | POA: Diagnosis not present

## 2017-01-31 DIAGNOSIS — M545 Low back pain: Secondary | ICD-10-CM | POA: Diagnosis not present

## 2017-01-31 DIAGNOSIS — I10 Essential (primary) hypertension: Secondary | ICD-10-CM | POA: Diagnosis not present

## 2017-01-31 DIAGNOSIS — M47812 Spondylosis without myelopathy or radiculopathy, cervical region: Secondary | ICD-10-CM | POA: Diagnosis not present

## 2017-02-13 DIAGNOSIS — I471 Supraventricular tachycardia: Secondary | ICD-10-CM | POA: Diagnosis not present

## 2017-02-13 DIAGNOSIS — I442 Atrioventricular block, complete: Secondary | ICD-10-CM | POA: Diagnosis not present

## 2017-02-13 DIAGNOSIS — I10 Essential (primary) hypertension: Secondary | ICD-10-CM | POA: Diagnosis not present

## 2017-02-16 ENCOUNTER — Other Ambulatory Visit: Payer: Self-pay | Admitting: Physician Assistant

## 2017-02-16 DIAGNOSIS — K219 Gastro-esophageal reflux disease without esophagitis: Secondary | ICD-10-CM

## 2017-03-05 DIAGNOSIS — Z45010 Encounter for checking and testing of cardiac pacemaker pulse generator [battery]: Secondary | ICD-10-CM | POA: Diagnosis not present

## 2017-03-05 DIAGNOSIS — Z45018 Encounter for adjustment and management of other part of cardiac pacemaker: Secondary | ICD-10-CM | POA: Diagnosis not present

## 2017-03-18 ENCOUNTER — Other Ambulatory Visit: Payer: Self-pay | Admitting: *Deleted

## 2017-03-18 DIAGNOSIS — I10 Essential (primary) hypertension: Secondary | ICD-10-CM

## 2017-03-18 MED ORDER — LOSARTAN POTASSIUM-HCTZ 100-12.5 MG PO TABS: 1.0000 | ORAL_TABLET | Freq: Every day | ORAL | 1 refills | Status: DC

## 2017-03-20 ENCOUNTER — Other Ambulatory Visit: Payer: Self-pay

## 2017-03-20 DIAGNOSIS — I10 Essential (primary) hypertension: Secondary | ICD-10-CM

## 2017-03-20 MED ORDER — LOSARTAN POTASSIUM-HCTZ 100-12.5 MG PO TABS: 1.0000 | ORAL_TABLET | Freq: Every day | ORAL | 1 refills | Status: DC

## 2017-03-25 DIAGNOSIS — F0280 Dementia in other diseases classified elsewhere without behavioral disturbance: Secondary | ICD-10-CM | POA: Diagnosis not present

## 2017-03-25 DIAGNOSIS — I10 Essential (primary) hypertension: Secondary | ICD-10-CM | POA: Diagnosis not present

## 2017-03-25 DIAGNOSIS — G301 Alzheimer's disease with late onset: Secondary | ICD-10-CM | POA: Diagnosis not present

## 2017-04-15 NOTE — Telephone Encounter (Signed)
Pt has been seen since and is no longer driving.

## 2017-04-22 ENCOUNTER — Other Ambulatory Visit: Payer: Self-pay | Admitting: *Deleted

## 2017-04-22 DIAGNOSIS — E7800 Pure hypercholesterolemia, unspecified: Secondary | ICD-10-CM

## 2017-04-22 DIAGNOSIS — H52223 Regular astigmatism, bilateral: Secondary | ICD-10-CM | POA: Diagnosis not present

## 2017-04-22 DIAGNOSIS — H02834 Dermatochalasis of left upper eyelid: Secondary | ICD-10-CM | POA: Diagnosis not present

## 2017-04-22 DIAGNOSIS — H02831 Dermatochalasis of right upper eyelid: Secondary | ICD-10-CM | POA: Diagnosis not present

## 2017-04-22 DIAGNOSIS — H43393 Other vitreous opacities, bilateral: Secondary | ICD-10-CM | POA: Diagnosis not present

## 2017-04-22 DIAGNOSIS — I10 Essential (primary) hypertension: Secondary | ICD-10-CM

## 2017-04-22 DIAGNOSIS — H2513 Age-related nuclear cataract, bilateral: Secondary | ICD-10-CM | POA: Diagnosis not present

## 2017-04-22 MED ORDER — ATORVASTATIN CALCIUM 20 MG PO TABS: ORAL_TABLET | ORAL | 1 refills | Status: DC

## 2017-04-22 MED ORDER — METOPROLOL TARTRATE 25 MG PO TABS: ORAL_TABLET | ORAL | 1 refills | Status: DC

## 2017-05-19 DIAGNOSIS — M545 Low back pain: Secondary | ICD-10-CM | POA: Diagnosis not present

## 2017-05-20 DIAGNOSIS — F0280 Dementia in other diseases classified elsewhere without behavioral disturbance: Secondary | ICD-10-CM | POA: Diagnosis not present

## 2017-05-20 DIAGNOSIS — G301 Alzheimer's disease with late onset: Secondary | ICD-10-CM | POA: Diagnosis not present

## 2017-05-23 ENCOUNTER — Other Ambulatory Visit: Payer: Self-pay | Admitting: *Deleted

## 2017-05-23 DIAGNOSIS — M5030 Other cervical disc degeneration, unspecified cervical region: Secondary | ICD-10-CM

## 2017-05-23 MED ORDER — MELOXICAM 15 MG PO TABS: ORAL_TABLET | ORAL | 0 refills | Status: DC

## 2017-06-11 DIAGNOSIS — Z45010 Encounter for checking and testing of cardiac pacemaker pulse generator [battery]: Secondary | ICD-10-CM | POA: Diagnosis not present

## 2017-06-11 DIAGNOSIS — Z45018 Encounter for adjustment and management of other part of cardiac pacemaker: Secondary | ICD-10-CM | POA: Diagnosis not present

## 2017-06-24 ENCOUNTER — Ambulatory Visit (INDEPENDENT_AMBULATORY_CARE_PROVIDER_SITE_OTHER): Payer: Medicare Other | Admitting: Physician Assistant

## 2017-06-24 ENCOUNTER — Encounter: Payer: Self-pay | Admitting: Physician Assistant

## 2017-06-24 VITALS — BP 135/71 | HR 71 | Temp 98.1°F | Resp 16 | Ht 67.5 in | Wt 179.0 lb

## 2017-06-24 DIAGNOSIS — F411 Generalized anxiety disorder: Secondary | ICD-10-CM | POA: Diagnosis not present

## 2017-06-24 DIAGNOSIS — F0280 Dementia in other diseases classified elsewhere without behavioral disturbance: Secondary | ICD-10-CM | POA: Diagnosis not present

## 2017-06-24 DIAGNOSIS — E782 Mixed hyperlipidemia: Secondary | ICD-10-CM | POA: Diagnosis not present

## 2017-06-24 DIAGNOSIS — R232 Flushing: Secondary | ICD-10-CM | POA: Diagnosis not present

## 2017-06-24 DIAGNOSIS — K219 Gastro-esophageal reflux disease without esophagitis: Secondary | ICD-10-CM | POA: Diagnosis not present

## 2017-06-24 DIAGNOSIS — G301 Alzheimer's disease with late onset: Secondary | ICD-10-CM | POA: Diagnosis not present

## 2017-06-24 DIAGNOSIS — I10 Essential (primary) hypertension: Secondary | ICD-10-CM

## 2017-06-24 DIAGNOSIS — M5030 Other cervical disc degeneration, unspecified cervical region: Secondary | ICD-10-CM | POA: Diagnosis not present

## 2017-06-24 MED ORDER — OMEPRAZOLE 40 MG PO CPDR
40.0000 mg | DELAYED_RELEASE_CAPSULE | Freq: Every day | ORAL | 4 refills | Status: DC
Start: 2017-06-24 — End: 2018-03-02

## 2017-06-24 MED ORDER — MELOXICAM 15 MG PO TABS
ORAL_TABLET | ORAL | 1 refills | Status: DC
Start: 2017-06-24 — End: 2017-10-29

## 2017-06-24 MED ORDER — CITALOPRAM HYDROBROMIDE 10 MG PO TABS: 10.0000 mg | ORAL_TABLET | Freq: Every day | ORAL | 3 refills | Status: DC

## 2017-06-24 MED ORDER — LOSARTAN POTASSIUM-HCTZ 100-12.5 MG PO TABS: 1.0000 | ORAL_TABLET | Freq: Every day | ORAL | 3 refills | Status: DC

## 2017-06-24 NOTE — Progress Notes (Signed)
Subjective:    Patient ID: Anthony Davila, male    DOB: 25-Sep-1940, 77 y.o.   MRN: [id number]  HPI  Patient is a 77 year old male who presents to the clinic with his ex-wife for his six-month follow-up and medication refills.  Patient is currently residing at a assisted living here in [place].  He really likes this set up.  They have daily activities for him.  They also could come to good meals a day.  Overall he feels like his mood is stable.  His ex-wife has no concerns or complaints.  He does complain of some flushing of his face intermittently throughout the day.  He denies any known triggers.  The only new medication would be his beta-blocker after his complete heart block diagnosis.  Otherwise he is sleeping well and staying active.  He denies any depression or anxiety.  .. Active Ambulatory Problems    Diagnosis Date Noted  . Rosacea 02/20/2015  . Essential hypertension 02/20/2015  . Hyperlipidemia 02/20/2015  . OSA on CPAP 02/20/2015  . Left lumbar radiculitis 02/20/2015  . Hypertriglyceridemia 02/20/2015  . Memory loss 02/20/2015  . Vitamin D deficiency 03/07/2015  . Generalized anxiety disorder 03/20/2015  . Neck pain 09/08/2015  . New onset of headaches after age 77 11/03/2015  . Problems of guilt 02/06/2016  . Family dynamics problem 02/06/2016  . Migraine without aura and without status migrainosus, not intractable 02/06/2016  . DDD (degenerative disc disease), cervical 06/05/2016  . 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 Late onset Alzheimer's disease without behavioral disturbance 11/01/2016  . 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 Complete heart block ([place]) 11/01/2016  . Epigastric discomfort 11/01/2016  . Gastroesophageal reflux disease without esophagitis 11/01/2016  . Bilateral hearing loss 12/25/2016   Resolved Ambulatory Problems    Diagnosis Date Noted  . Cervical spondylosis without myelopathy 09/08/2015  . Mild cognitive impairment  02/06/2016  . Cervical spondylosis with myelopathy and radiculopathy 06/05/2016   Past Medical History:  Diagnosis Date  . Essential hypertension 02/20/2015  . Left lumbar radiculitis 02/20/2015  . OSA on CPAP 02/20/2015      Review of Systems    see HPI.  Objective:   Physical Exam        Assessment & Plan:  Marland KitchenMarland KitchenRenne was seen today for 6 month follow up.  Diagnoses and all orders for this visit:  Late onset Alzheimer's disease without behavioral disturbance  Essential hypertension -     losartan-hydrochlorothiazide (HYZAAR) 100-12.5 MG tablet; Take 1 tablet by mouth daily. -     CBC with Differential/Platelet -     COMPLETE METABOLIC PANEL WITH GFR  Gastroesophageal reflux disease without esophagitis -     omeprazole (PRILOSEC) 40 MG capsule; Take 1 capsule (40 mg total) by mouth daily.  DDD (degenerative disc disease), cervical -     meloxicam (MOBIC) 15 MG tablet; One tab PO qAM with breakfast for 2 weeks, then daily prn pain.  Generalized anxiety disorder -     citalopram (CELEXA) 10 MG tablet; Take 1 tablet (10 mg total) by mouth daily.  Mixed hyperlipidemia -     Lipid Panel w/reflex Direct LDL  Flushing -     CBC with Differential/Platelet   .Marland Kitchen Depression screen [facility] 2/9 06/24/2017 09/24/2016 04/02/2016  Decreased Interest 0 1 0  Down, Depressed, Hopeless 0 1 1  PHQ - 2 Score 0 2 1  Altered sleeping 0 1 -  Tired, decreased energy 0 1 -  Change in  appetite 0 1 -  Feeling bad or failure about yourself  0 1 -  Trouble concentrating 0 0 -  Moving slowly or fidgety/restless 0 1 -  Suicidal thoughts 0 0 -  PHQ-9 Score 0 7 -  Difficult doing work/chores Not difficult at all - -   .Marland Kitchen GAD 7 : Generalized Anxiety Score 06/24/2017  Nervous, Anxious, on Edge 0  Control/stop worrying 0  Worry too much - different things 0  Trouble relaxing 0  Restless 1  Easily annoyed or irritable 0  Afraid - awful might happen 0  Total GAD 7 Score 1  Anxiety Difficulty  Not difficult at all   BP looks great.   Overall very happy with how the patient is doing.  He seems to be very happy at his current placement.  I encouraged him to stay active and walk and participate in group activities.  I refilled his medications today for his chronic needs.  It has been a while since he has had fasting labs.  Unsure where this flushing is coming from.  We will check a CBC to look for anemia and any hematocrit issues.  Certainly could be medication induced.  We would have to consider risk versus benefit of change in these medications.  Does not seem to be actively bothering him every day.  There is no itching associated.  We will continue to monitor.

## 2017-06-27 DIAGNOSIS — R232 Flushing: Secondary | ICD-10-CM | POA: Diagnosis not present

## 2017-06-27 DIAGNOSIS — E782 Mixed hyperlipidemia: Secondary | ICD-10-CM | POA: Diagnosis not present

## 2017-06-27 DIAGNOSIS — I10 Essential (primary) hypertension: Secondary | ICD-10-CM | POA: Diagnosis not present

## 2017-06-27 LAB — COMPLETE METABOLIC PANEL WITH GFR
AG RATIO: 1.7 (calc) (ref 1.0–2.5)
ALT: 51 U/L — AB (ref 9–46)
AST: 35 U/L (ref 10–35)
Albumin: 4.3 g/dL (ref 3.6–5.1)
Alkaline phosphatase (APISO): 75 U/L (ref 40–115)
BUN: 23 mg/dL (ref 7–25)
CALCIUM: 9.3 mg/dL (ref 8.6–10.3)
CO2: 29 mmol/L (ref 20–32)
Chloride: 106 mmol/L (ref 98–110)
Creat: 1.04 mg/dL (ref 0.70–1.18)
GFR, EST AFRICAN AMERICAN: 81 mL/min/{1.73_m2} (ref >=60)
GFR, EST NON AFRICAN AMERICAN: 70 mL/min/{1.73_m2} (ref >=60)
Globulin: 2.6 g/dL (calc) (ref 1.9–3.7)
Glucose, Bld: 95 mg/dL (ref 65–99)
POTASSIUM: 4.2 mmol/L (ref 3.5–5.3)
Sodium: 142 mmol/L (ref 135–146)
TOTAL PROTEIN: 6.9 g/dL (ref 6.1–8.1)
Total Bilirubin: 0.8 mg/dL (ref 0.2–1.2)

## 2017-06-27 LAB — CBC WITH DIFFERENTIAL/PLATELET
BASOS PCT: 0.3 %
Basophils Absolute: 18 cells/uL (ref 0–200)
Eosinophils Absolute: 336 cells/uL (ref 15–500)
Eosinophils Relative: 5.6 %
HCT: 37 % — ABNORMAL LOW (ref 38.5–50.0)
Hemoglobin: 12.8 g/dL — ABNORMAL LOW (ref 13.2–17.1)
LYMPHS ABS: 1572 {cells}/uL (ref 850–3900)
MCH: 32.8 pg (ref 27.0–33.0)
MCHC: 34.6 g/dL (ref 32.0–36.0)
MCV: 94.9 fL (ref 80.0–100.0)
MPV: 12.4 fL (ref 7.5–12.5)
Monocytes Relative: 9.6 %
NEUTROS ABS: 3498 {cells}/uL (ref 1500–7800)
Neutrophils Relative %: 58.3 %
PLATELETS: 135 10*3/uL — AB (ref 140–400)
RBC: 3.9 10*6/uL — AB (ref 4.20–5.80)
RDW: 11.9 % (ref 11.0–15.0)
TOTAL LYMPHOCYTE: 26.2 %
WBC: 6 10*3/uL (ref 3.8–10.8)
WBCMIX: 576 {cells}/uL (ref 200–950)

## 2017-06-27 LAB — LIPID PANEL W/REFLEX DIRECT LDL
Cholesterol: 138 mg/dL (ref <200)
HDL: 50 mg/dL (ref >40)
LDL CHOLESTEROL (CALC): 66 mg/dL
Non-HDL Cholesterol (Calc): 88 mg/dL (calc) (ref <130)
Total CHOL/HDL Ratio: 2.8 (calc) (ref <5.0)
Triglycerides: 141 mg/dL (ref <150)

## 2017-06-30 NOTE — Progress Notes (Signed)
Call pt: cholesterol much better.  Hemoglobin looks good.  Liver enzyme up just a tad. Recheck in 3 months. Could be cholesterol medication.

## 2017-09-10 DIAGNOSIS — I442 Atrioventricular block, complete: Secondary | ICD-10-CM | POA: Diagnosis not present

## 2017-10-02 DIAGNOSIS — M545 Low back pain: Secondary | ICD-10-CM | POA: Diagnosis not present

## 2017-10-02 DIAGNOSIS — M5416 Radiculopathy, lumbar region: Secondary | ICD-10-CM | POA: Diagnosis not present

## 2017-10-05 IMAGING — MR MR CERVICAL SPINE W/O CM
5 series · 33 of 48 positions shown · non-contrast
Comparison: Plain film cervical spine 09/08/2015

CLINICAL DATA: Neck pain and stiffness in the left arm with
tingling in left arm and fingers for 1 month. No known injury.

EXAM:
MRI CERVICAL SPINE WITHOUT CONTRAST
TECHNIQUE: Multiplanar, multisequence MR imaging of the cervical spine was
performed. No intravenous contrast was administered.

[Series 2: T2 · sagittal · 3.0mm · 0.56mm/px · 6 of 13 slices shown (1 of 2)]
[im 1/13]
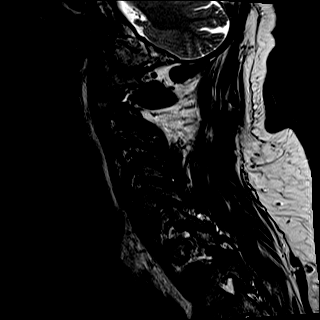
[im 3/13]
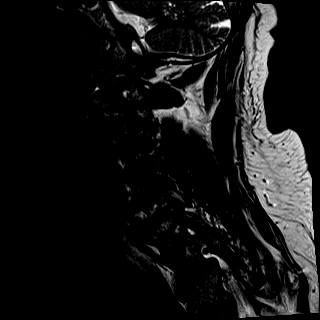
[im 5/13]
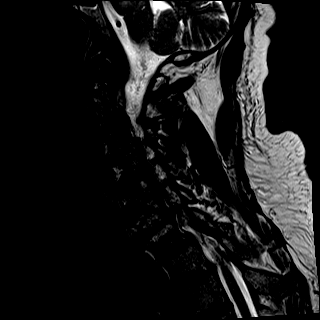
[im 8/13]
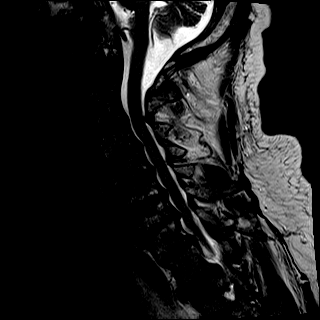
[im 10/13]
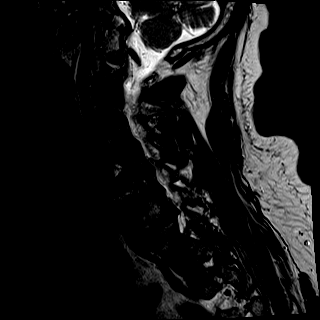
[im 13/13]
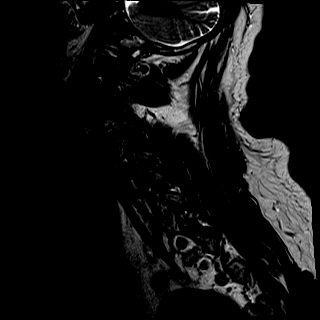

[Series 3: T1 · sagittal · 3.0mm · 0.70mm/px · 6 of 13 slices shown]
[im 1/13]
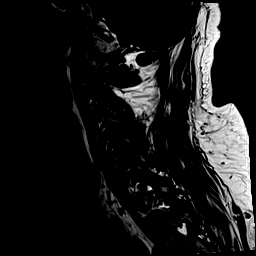
[im 3/13]
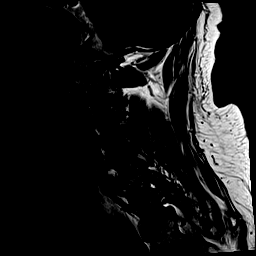
[im 5/13]
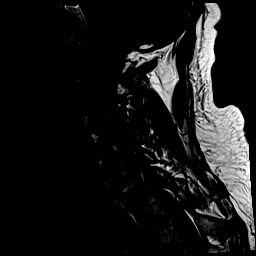
[im 8/13]
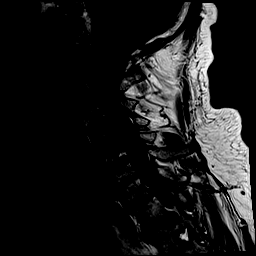
[im 10/13]
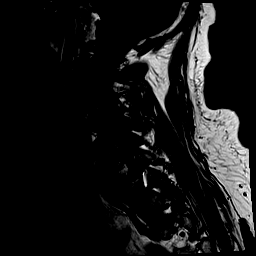
[im 13/13]
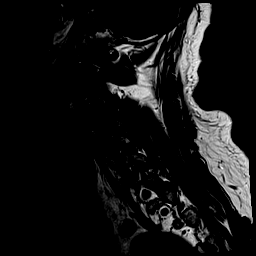

[Series 4: STIR · sagittal · 3.0mm · 0.35mm/px · 6 of 13 slices shown]
[im 1/13]
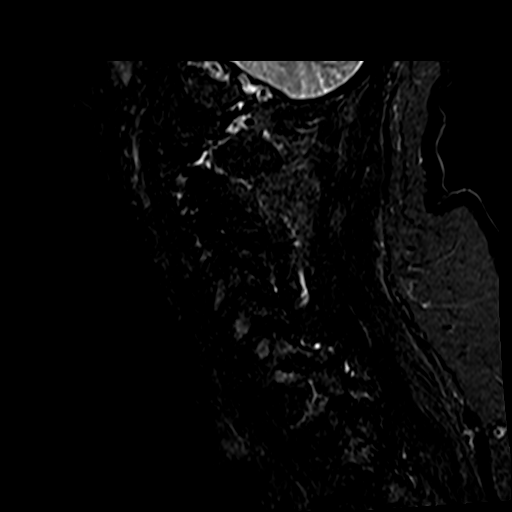
[im 3/13]
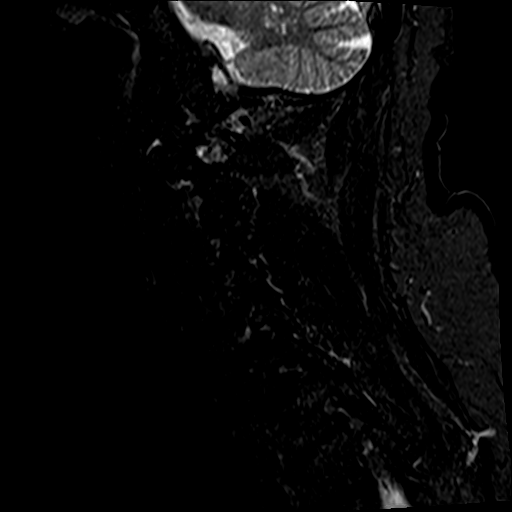
[im 5/13]
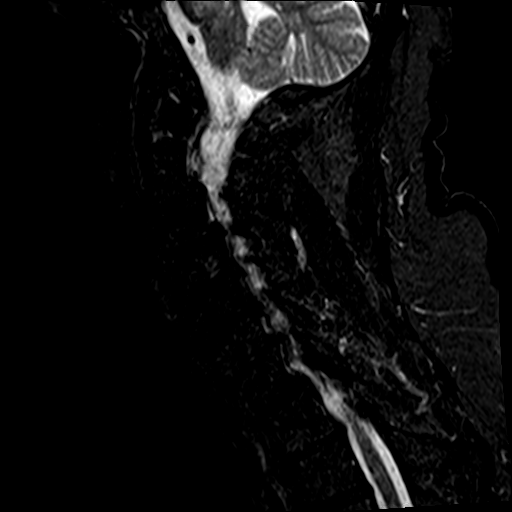
[im 8/13]
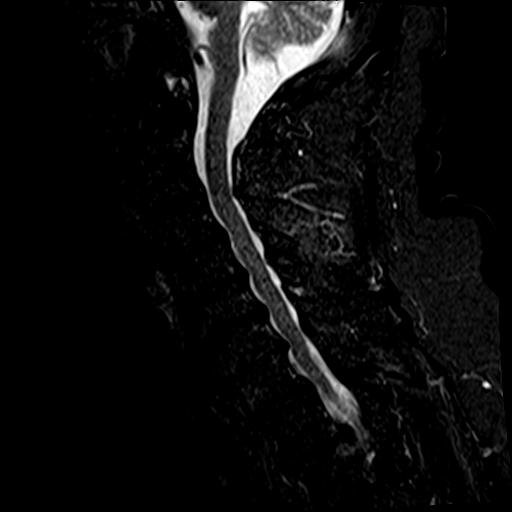
[im 10/13]
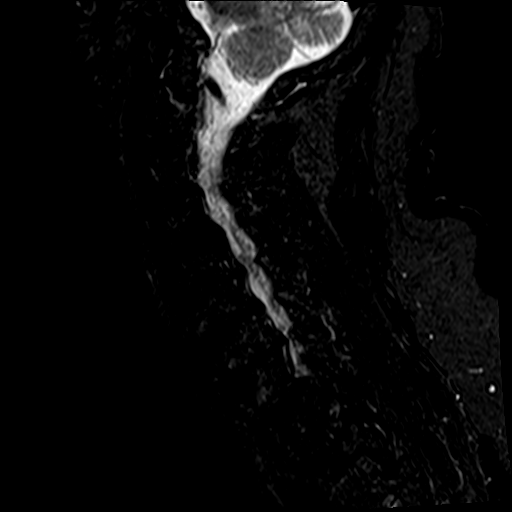
[im 13/13]
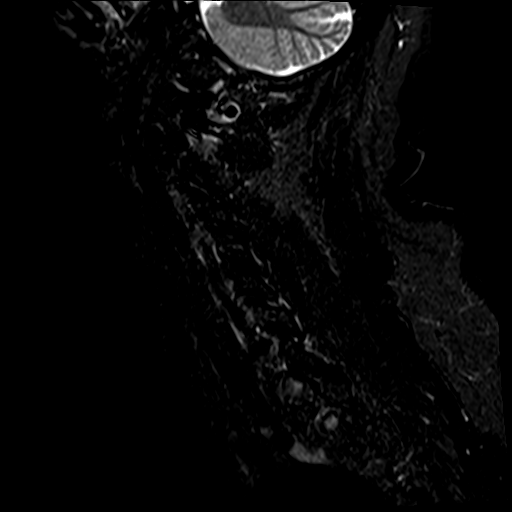

[Series 5: T2 · axial · 3.0mm · 0.62mm/px · z∈[-139,-36]mm · 9 of 31 slices shown (2 of 2)]
[im 1/31]
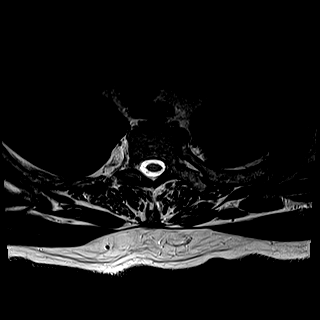
[im 5/31]
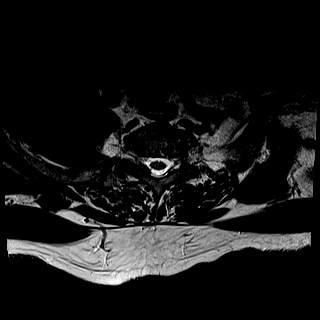
[im 9/31]
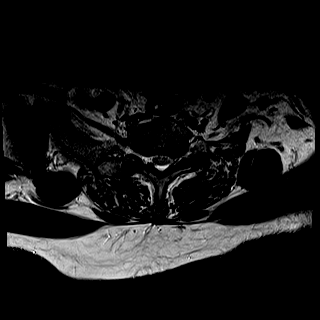
[im 13/31]
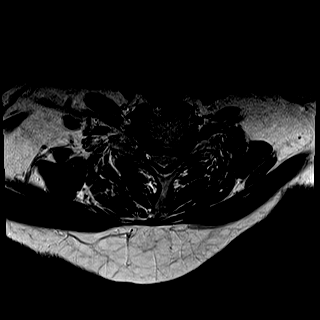
[im 16/31]
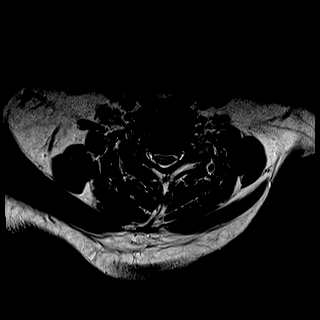
[im 18/31]
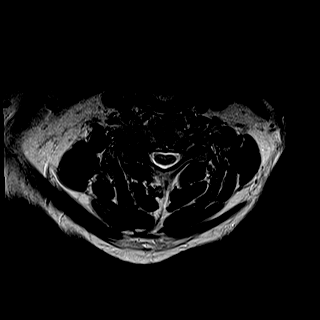
[im 22/31]
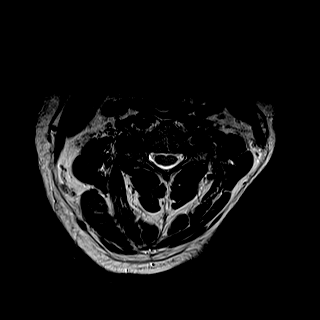
[im 26/31]
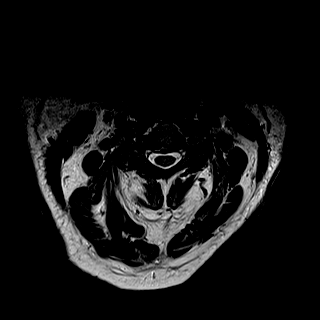
[im 31/31]
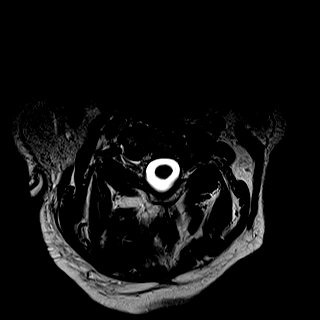

[Series 6: mpgr ax · axial · 3.0mm · 0.37mm/px · z∈[-125,-53]mm · 6 of 31 slices shown]
[im 1/31]
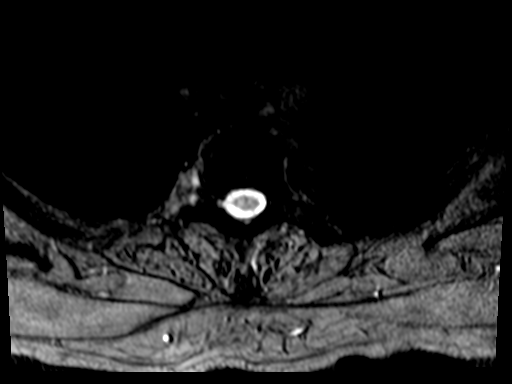
[im 5/31]
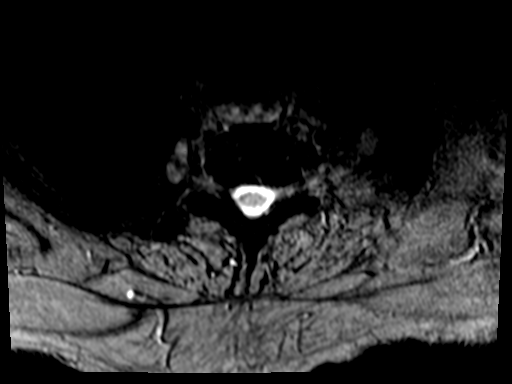
[im 9/31]
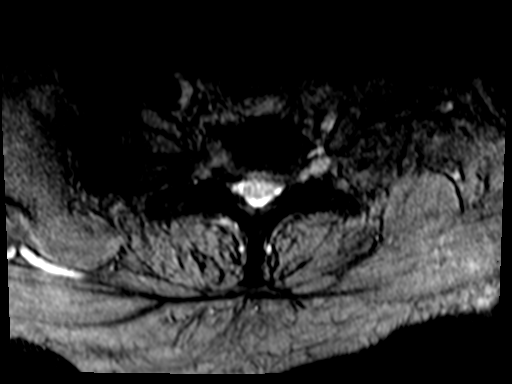
[im 13/31]
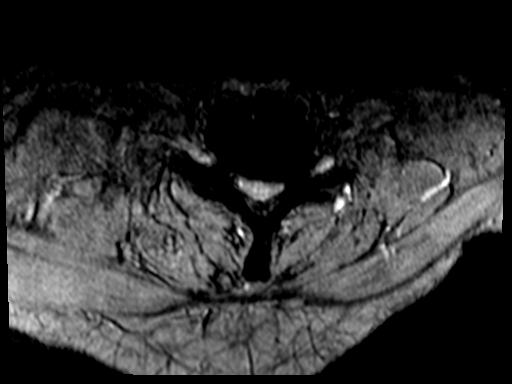
[im 18/31]
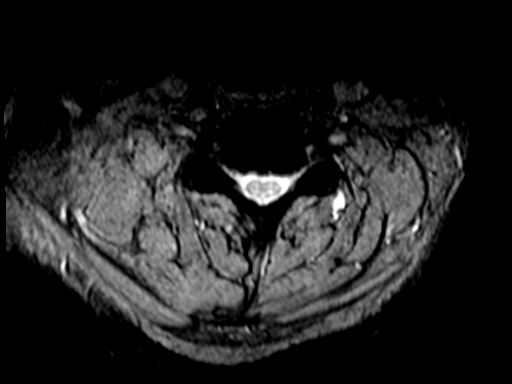
[im 22/31]
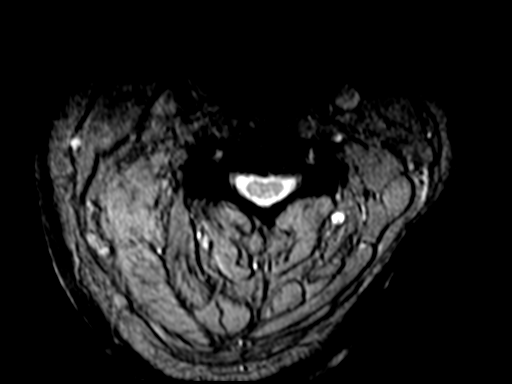

[33 of 48 positions shown; findings below may reference images not displayed]

FINDINGS: Alignment: Straightening of lordosis is noted. 0.3 cm
anterolisthesis C7 on T1 due to facet arthropathy is identified.

Vertebrae: Degenerative endplate signal change is seen at C4-5, C5-6
and C6-7. No fracture or worrisome lesion. The patient has a
congenitally narrow central spinal canal.

Cord: Normal signal throughout.

Posterior Fossa, vertebral arteries, paraspinal tissues: Negative.

Disc levels:

C2-3: Shallow disc bulge and uncovertebral spurring. Facet
degenerative disease is worse on the left. The central canal is
open. Moderate to moderately severe bilateral foraminal narrowing is
seen.

C3-4: Shallow broad-based disc bulge, uncovertebral spurring and
right worse than left facet arthropathy. There is slight flattening
of the ventral cord. Moderate to moderately severe foraminal
narrowing is worse on the right.

C4-5: Disc bulge and uncovertebral disease cause some flattening of
the ventral cord and severe bilateral foraminal narrowing.

C5-6: Broad-based disc bulge and uncovertebral disease. There is
slight flattening of the ventral cord. Severe bilateral foraminal
narrowing is identified.

C6-7: Disc bulge and uncovertebral disease result in flattening of
the cord and severe bilateral foraminal narrowing.

C7-T1: Bilateral facet degenerative change. The disc is uncovered
with a shallow bulge but the central canal is open. Mild to moderate
bilateral foraminal narrowing is identified.
IMPRESSION: Congenital and acquired central canal stenosis appears worst at C6-7
where there is flattening of the cord. Milder degree of narrowing is
present at C3-4, C4-5 and C5-6.

Multilevel facet arthropathy and uncovertebral spurring cause
foraminal narrowing as described above. Narrowing appears worst
bilaterally at C4-5, C5-6 and C6-7.

Please see above for descriptions of individual levels.

## 2017-10-16 DIAGNOSIS — G301 Alzheimer's disease with late onset: Secondary | ICD-10-CM | POA: Diagnosis not present

## 2017-10-16 DIAGNOSIS — F0280 Dementia in other diseases classified elsewhere without behavioral disturbance: Secondary | ICD-10-CM | POA: Diagnosis not present

## 2017-10-16 DIAGNOSIS — R259 Unspecified abnormal involuntary movements: Secondary | ICD-10-CM | POA: Diagnosis not present

## 2017-10-20 ENCOUNTER — Other Ambulatory Visit: Payer: Self-pay | Admitting: Physician Assistant

## 2017-10-20 DIAGNOSIS — E7800 Pure hypercholesterolemia, unspecified: Secondary | ICD-10-CM

## 2017-10-29 ENCOUNTER — Ambulatory Visit (INDEPENDENT_AMBULATORY_CARE_PROVIDER_SITE_OTHER): Payer: Medicare Other | Admitting: Physician Assistant

## 2017-10-29 ENCOUNTER — Encounter: Payer: Self-pay | Admitting: Physician Assistant

## 2017-10-29 VITALS — BP 145/81 | HR 72 | Temp 98.5°F | Ht 67.5 in | Wt 174.0 lb

## 2017-10-29 DIAGNOSIS — R6889 Other general symptoms and signs: Secondary | ICD-10-CM

## 2017-10-29 DIAGNOSIS — Z23 Encounter for immunization: Secondary | ICD-10-CM

## 2017-10-29 DIAGNOSIS — R252 Cramp and spasm: Secondary | ICD-10-CM | POA: Diagnosis not present

## 2017-10-29 DIAGNOSIS — I10 Essential (primary) hypertension: Secondary | ICD-10-CM

## 2017-10-29 DIAGNOSIS — E782 Mixed hyperlipidemia: Secondary | ICD-10-CM

## 2017-10-29 DIAGNOSIS — E876 Hypokalemia: Secondary | ICD-10-CM

## 2017-10-29 DIAGNOSIS — M5030 Other cervical disc degeneration, unspecified cervical region: Secondary | ICD-10-CM | POA: Diagnosis not present

## 2017-10-29 DIAGNOSIS — F411 Generalized anxiety disorder: Secondary | ICD-10-CM | POA: Diagnosis not present

## 2017-10-29 MED ORDER — MELOXICAM 15 MG PO TABS: ORAL_TABLET | ORAL | 1 refills | Status: DC

## 2017-10-29 MED ORDER — ATORVASTATIN CALCIUM 20 MG PO TABS: 20.0000 mg | ORAL_TABLET | Freq: Every day | ORAL | 3 refills | Status: DC

## 2017-10-29 MED ORDER — DULOXETINE HCL 30 MG PO CPEP: 30.0000 mg | ORAL_CAPSULE | Freq: Every day | ORAL | 1 refills | Status: DC

## 2017-10-29 MED ORDER — LOSARTAN POTASSIUM-HCTZ 100-12.5 MG PO TABS: 1.0000 | ORAL_TABLET | Freq: Every day | ORAL | 3 refills | Status: DC

## 2017-10-29 MED ORDER — POTASSIUM CHLORIDE ER 10 MEQ PO TBCR: 10.0000 meq | EXTENDED_RELEASE_TABLET | Freq: Every day | ORAL | 3 refills | Status: DC

## 2017-10-29 MED ORDER — METOPROLOL TARTRATE 25 MG PO TABS: ORAL_TABLET | ORAL | 3 refills | Status: DC

## 2017-10-29 NOTE — Progress Notes (Signed)
Subjective:    Patient ID: Anthony Davila, male    DOB: [birth year], 77 y.o.   MRN: [id number]  HPI The pt is a 77 year old with late onset Alzheimer's, complete heart block with pacemaker, migraine, GERD who presents to the clinic with his ex-wife.  Overall he has been doing okay.  His neurologist would like his Celexa to be switched to something else since he is having some jerking motions.  She worries this could be some side effects of akinesia.  He denies any new medication or medication changes recently.  For the last 3 to 4 days he has had a sensation of feeling hot.  He just feels like his heart from the inside out.  He is not necessarily sweating.  Pt is taking OTC  Potassium and wonders if he could get prescription.   Migraines are controlled. He rarely has headaches. He does need refill mobic to use as needed.   .. Active Ambulatory Problems    Diagnosis Date Noted  . Rosacea 02/20/2015  . Essential hypertension 02/20/2015  . Hyperlipidemia 02/20/2015  . OSA on CPAP 02/20/2015  . Left lumbar radiculitis 02/20/2015  . Hypertriglyceridemia 02/20/2015  . Memory loss 02/20/2015  . Vitamin D deficiency 03/07/2015  . Generalized anxiety disorder 03/20/2015  . Neck pain 09/08/2015  . New onset of headaches after age 31 11/03/2015  . Problems of guilt 02/06/2016  . Family dynamics problem 02/06/2016  . Migraine without aura and without status migrainosus, not intractable 02/06/2016  . DDD (degenerative disc disease), cervical 06/05/2016  . 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 Late onset Alzheimer's disease without behavioral disturbance ([place]) 11/01/2016  . 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 Complete heart block ([place]) 11/01/2016  . Epigastric discomfort 11/01/2016  . Gastroesophageal reflux disease without esophagitis 11/01/2016  . Bilateral hearing loss 12/25/2016  . Jerking movements of extremities 11/03/2017  . Sensation of feeling hot 11/03/2017  .  Hypokalemia 11/03/2017   Resolved Ambulatory Problems    Diagnosis Date Noted  . Cervical spondylosis without myelopathy 09/08/2015  . Mild cognitive impairment 02/06/2016  . Cervical spondylosis with myelopathy and radiculopathy 06/05/2016   No Additional Past Medical History      Review of Systems  All other systems reviewed and are negative.      Objective:   Physical Exam  Constitutional: He is oriented to person, place, and time. He appears well-developed and well-nourished.  HENT:  Head: Normocephalic and atraumatic.  Neck: Normal range of motion. Neck supple.  Cardiovascular: Normal rate and regular rhythm.  Pulmonary/Chest: Effort normal and breath sounds normal.  Neurological: He is alert and oriented to person, place, and time.  Psychiatric: He has a normal mood and affect. His behavior is normal.          Assessment & Plan:  Marland KitchenMarland KitchenDiagnoses and all orders for this visit:  Sensation of feeling hot -     TSH -     COMPLETE METABOLIC PANEL WITH GFR -     CBC with Differential/Platelet  DDD (degenerative disc disease), cervical -     meloxicam (MOBIC) 15 MG tablet; One tab PO qAM with breakfast for 2 weeks, then daily prn pain.  Essential hypertension -     losartan-hydrochlorothiazide (HYZAAR) 100-12.5 MG tablet; Take 1 tablet by mouth daily. -     metoprolol tartrate (LOPRESSOR) 25 MG tablet; TAKE ONE-HALF TABLET BY MOUTH TWICE DAILY FOR BLOOD PRESSURE. -     COMPLETE METABOLIC PANEL WITH GFR -  CBC with Differential/Platelet  Mixed hyperlipidemia -     atorvastatin (LIPITOR) 20 MG tablet; Take 1 tablet (20 mg total) by mouth daily.  Jerking movements of extremities -     CBC with Differential/Platelet  Hypokalemia -     potassium chloride (K-DUR) 10 MEQ tablet; Take 1 tablet (10 mEq total) by mouth daily.  Generalized anxiety disorder -     DULoxetine (CYMBALTA) 30 MG capsule; Take 1 capsule (30 mg total) by mouth daily.  unsure where "hot  sensation" is coming from. Will check some labs. No medications have changed unless he is not taking some he thought he was taking. No fever today or signs of infection. Discussed with patient and caregiver to watch for signs of infection. He has been on same medications for a while. Read note from neurologist that suggested he might be having a side effect of jerking movements from SSRI. Switched today to [employer]. Discussed side effects. Follow up in 1 month.   Labs rechecked.   Given low dose prescription of potassium. Will need to check potassium when been on it for a few weeks.

## 2017-10-29 NOTE — Patient Instructions (Signed)
Stop celexa. Start cymbalta once a day.   Follow up in 4 weeks.

## 2017-10-30 LAB — COMPLETE METABOLIC PANEL WITH GFR
AG Ratio: 1.9 (calc) (ref 1.0–2.5)
ALT: 38 U/L (ref 9–46)
AST: 30 U/L (ref 10–35)
Albumin: 4.7 g/dL (ref 3.6–5.1)
Alkaline phosphatase (APISO): 95 U/L (ref 40–115)
BUN: 23 mg/dL (ref 7–25)
CALCIUM: 9.8 mg/dL (ref 8.6–10.3)
CO2: 28 mmol/L (ref 20–32)
CREATININE: 1.13 mg/dL (ref 0.70–1.18)
Chloride: 105 mmol/L (ref 98–110)
GFR, EST AFRICAN AMERICAN: 73 mL/min/{1.73_m2} (ref >=60)
GFR, EST NON AFRICAN AMERICAN: 63 mL/min/{1.73_m2} (ref >=60)
GLUCOSE: 92 mg/dL (ref 65–99)
Globulin: 2.5 g/dL (calc) (ref 1.9–3.7)
Potassium: 4.6 mmol/L (ref 3.5–5.3)
Sodium: 143 mmol/L (ref 135–146)
TOTAL PROTEIN: 7.2 g/dL (ref 6.1–8.1)
Total Bilirubin: 1.5 mg/dL — ABNORMAL HIGH (ref 0.2–1.2)

## 2017-10-30 LAB — CBC WITH DIFFERENTIAL/PLATELET
BASOS ABS: 28 {cells}/uL (ref 0–200)
Basophils Relative: 0.4 %
EOS PCT: 1.2 %
Eosinophils Absolute: 83 cells/uL (ref 15–500)
HCT: 40.3 % (ref 38.5–50.0)
HEMOGLOBIN: 13.8 g/dL (ref 13.2–17.1)
Lymphs Abs: 1325 cells/uL (ref 850–3900)
MCH: 32.9 pg (ref 27.0–33.0)
MCHC: 34.2 g/dL (ref 32.0–36.0)
MCV: 96.2 fL (ref 80.0–100.0)
MONOS PCT: 8.9 %
MPV: 12.2 fL (ref 7.5–12.5)
NEUTROS ABS: 4851 {cells}/uL (ref 1500–7800)
NEUTROS PCT: 70.3 %
Platelets: 172 10*3/uL (ref 140–400)
RBC: 4.19 10*6/uL — ABNORMAL LOW (ref 4.20–5.80)
RDW: 11.7 % (ref 11.0–15.0)
Total Lymphocyte: 19.2 %
WBC mixed population: 614 cells/uL (ref 200–950)
WBC: 6.9 10*3/uL (ref 3.8–10.8)

## 2017-10-30 LAB — TSH: TSH: 2.57 mIU/L (ref 0.40–4.50)

## 2017-10-30 NOTE — Progress Notes (Signed)
Call pt: thyroid normal. Kidney and liver look good. The only thing I see is bilirubin is a little elevated more than last check. Liver enzymes look great.  How is "hot" sensation today? l would like to recheck in 2-3 weeks.

## 2017-10-31 ENCOUNTER — Other Ambulatory Visit: Payer: Self-pay

## 2017-10-31 DIAGNOSIS — R17 Unspecified jaundice: Secondary | ICD-10-CM

## 2017-11-03 ENCOUNTER — Encounter: Payer: Self-pay | Admitting: Physician Assistant

## 2017-11-03 ENCOUNTER — Telehealth: Payer: Self-pay | Admitting: Physician Assistant

## 2017-11-03 DIAGNOSIS — R6889 Other general symptoms and signs: Secondary | ICD-10-CM | POA: Insufficient documentation

## 2017-11-03 DIAGNOSIS — F0280 Dementia in other diseases classified elsewhere without behavioral disturbance: Secondary | ICD-10-CM

## 2017-11-03 DIAGNOSIS — R252 Cramp and spasm: Secondary | ICD-10-CM | POA: Insufficient documentation

## 2017-11-03 DIAGNOSIS — G301 Alzheimer's disease with late onset: Principal | ICD-10-CM

## 2017-11-03 DIAGNOSIS — E876 Hypokalemia: Secondary | ICD-10-CM | POA: Insufficient documentation

## 2017-11-03 DIAGNOSIS — Z23 Encounter for immunization: Secondary | ICD-10-CM | POA: Diagnosis not present

## 2017-11-03 NOTE — Telephone Encounter (Signed)
Done

## 2017-11-03 NOTE — Telephone Encounter (Signed)
Please document flu shot that was given.

## 2017-11-12 DIAGNOSIS — M5416 Radiculopathy, lumbar region: Secondary | ICD-10-CM | POA: Diagnosis not present

## 2017-11-18 DIAGNOSIS — R17 Unspecified jaundice: Secondary | ICD-10-CM | POA: Diagnosis not present

## 2017-11-18 LAB — COMPLETE METABOLIC PANEL WITH GFR
AG RATIO: 1.6 (calc) (ref 1.0–2.5)
ALKALINE PHOSPHATASE (APISO): 95 U/L (ref 40–115)
ALT: 46 U/L (ref 9–46)
AST: 27 U/L (ref 10–35)
Albumin: 4.2 g/dL (ref 3.6–5.1)
BILIRUBIN TOTAL: 0.9 mg/dL (ref 0.2–1.2)
BUN/Creatinine Ratio: 31 (calc) — ABNORMAL HIGH (ref 6–22)
BUN: 29 mg/dL — ABNORMAL HIGH (ref 7–25)
CO2: 28 mmol/L (ref 20–32)
Calcium: 9.2 mg/dL (ref 8.6–10.3)
Chloride: 107 mmol/L (ref 98–110)
Creat: 0.94 mg/dL (ref 0.70–1.18)
GFR, Est African American: 91 mL/min/{1.73_m2} (ref >=60)
GFR, Est Non African American: 78 mL/min/{1.73_m2} (ref >=60)
GLOBULIN: 2.6 g/dL (ref 1.9–3.7)
Glucose, Bld: 107 mg/dL — ABNORMAL HIGH (ref 65–99)
POTASSIUM: 4.2 mmol/L (ref 3.5–5.3)
SODIUM: 142 mmol/L (ref 135–146)
Total Protein: 6.8 g/dL (ref 6.1–8.1)

## 2017-11-19 NOTE — Progress Notes (Signed)
Can we look to see if Jean(his ex wife who brings him in to appts) is on call with results list. Labs are negative. Bilirubin improved and now in normal range.

## 2017-11-20 NOTE — Progress Notes (Signed)
Yes he is just not able to interpret information.

## 2017-11-25 ENCOUNTER — Other Ambulatory Visit: Payer: Self-pay

## 2017-11-26 ENCOUNTER — Encounter: Payer: Self-pay | Admitting: Physician Assistant

## 2017-11-26 ENCOUNTER — Ambulatory Visit (INDEPENDENT_AMBULATORY_CARE_PROVIDER_SITE_OTHER): Payer: Medicare Other | Admitting: Physician Assistant

## 2017-11-26 VITALS — BP 120/70 | HR 67 | Ht 67.5 in | Wt 172.0 lb

## 2017-11-26 DIAGNOSIS — I10 Essential (primary) hypertension: Secondary | ICD-10-CM

## 2017-11-26 DIAGNOSIS — F0280 Dementia in other diseases classified elsewhere without behavioral disturbance: Secondary | ICD-10-CM | POA: Diagnosis not present

## 2017-11-26 DIAGNOSIS — R252 Cramp and spasm: Secondary | ICD-10-CM

## 2017-11-26 DIAGNOSIS — F411 Generalized anxiety disorder: Secondary | ICD-10-CM | POA: Diagnosis not present

## 2017-11-26 DIAGNOSIS — G301 Alzheimer's disease with late onset: Secondary | ICD-10-CM | POA: Diagnosis not present

## 2017-11-26 MED ORDER — DULOXETINE HCL 60 MG PO CPEP: 60.0000 mg | ORAL_CAPSULE | Freq: Every day | ORAL | 5 refills | Status: DC

## 2017-11-26 NOTE — Patient Instructions (Signed)
Cymbalta was increased to 60mg  daily.

## 2017-11-28 NOTE — Progress Notes (Signed)
Subjective:    Patient ID: Anthony Davila, male    DOB: March 11, 1940, 77 y.o.   MRN: [id number]  HPI Pt is a 77 yo male with late onset alzheimers who presents to the clinic for follow up and accompanied by ex-wife. Last visit pt was a having a hot sensation that we worked up. Found that his bilirubin was elevated initally but then resolved on its on. Pt is no longer experiencing the "hot sensation". We also replaced celexa for cymbalta to see if SSRI was contributing to his abnormal extra movements. Per patient and ex wife he feels like they are better as well. He does feel like his anxiety and paranoia are a little worse. He feels anxious thinking about his health and what is going to happen to him. He admits he feels lonely. He denies hearing any voices or the voices telling him what to do.  He continues to be responsible for own medication.  .. Active Ambulatory Problems    Diagnosis Date Noted  . Rosacea 02/20/2015  . Essential hypertension 02/20/2015  . Hyperlipidemia 02/20/2015  . OSA on CPAP 02/20/2015  . Left lumbar radiculitis 02/20/2015  . Hypertriglyceridemia 02/20/2015  . Memory loss 02/20/2015  . Vitamin D deficiency 03/07/2015  . Generalized anxiety disorder 03/20/2015  . Neck pain 09/08/2015  . New onset of headaches after age 2 11/03/2015  . Problems of guilt 02/06/2016  . Family dynamics problem 02/06/2016  . Migraine without aura and without status migrainosus, not intractable 02/06/2016  . DDD (degenerative disc disease), cervical 06/05/2016  . 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 Late onset Alzheimer's disease without behavioral disturbance ([place]) 11/01/2016  . 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 Complete heart block ([place]) 11/01/2016  . Epigastric discomfort 11/01/2016  . Gastroesophageal reflux disease without esophagitis 11/01/2016  . Bilateral hearing loss 12/25/2016  . Jerking movements of extremities 11/03/2017  . Sensation of feeling  hot 11/03/2017  . Hypokalemia 11/03/2017   Resolved Ambulatory Problems    Diagnosis Date Noted  . Cervical spondylosis without myelopathy 09/08/2015  . Mild cognitive impairment 02/06/2016  . Cervical spondylosis with myelopathy and radiculopathy 06/05/2016   No Additional Past Medical History      Review of Systems  All other systems reviewed and are negative.      Objective:   Physical Exam  Constitutional: He is oriented to person, place, and time. He appears well-developed and well-nourished.  HENT:  Head: Normocephalic and atraumatic.  Cardiovascular: Normal rate and regular rhythm.  Pulmonary/Chest: Effort normal and breath sounds normal.  Neurological: He is alert and oriented to person, place, and time.  Psychiatric: He has a normal mood and affect. His behavior is normal.          Assessment & Plan:  Marland KitchenMarland KitchenDiagnoses and all orders for this visit:  Generalized anxiety disorder -     DULoxetine (CYMBALTA) 60 MG capsule; Take 1 capsule (60 mg total) by mouth daily.  Essential hypertension  Late onset Alzheimer's disease without behavioral disturbance ([facility])  Jerking movements of extremities -     DULoxetine (CYMBALTA) 60 MG capsule; Take 1 capsule (60 mg total) by mouth daily.   Jerking movements resolved with stopping SSRI and transitioning to [facility].  Explained lab looked great and encouraged to find "hot sensation" has resolved.  I did increase cymbalta to see if could help with some anxiety. I discussed transitioning him to a facility with more care. I do think he would benefit from more  socialization. He does agree. I will speak with his daughter about this in the near future. Discussed if anxiety and paranoia is getting worse let me know. We might consider after the holidays making a housing change.   I am concerned patient could not be taking all of medications and that he needs more care.   Follow up in 3 months or sooner.

## 2017-11-28 NOTE — Telephone Encounter (Signed)
Can we call patients daughter "jody" and let her know that I would like to talk to her about her fathers well being. This is non urgent but when is a good time to call and best number?

## 2017-12-10 DIAGNOSIS — Z45018 Encounter for adjustment and management of other part of cardiac pacemaker: Secondary | ICD-10-CM | POA: Diagnosis not present

## 2017-12-10 DIAGNOSIS — Z45010 Encounter for checking and testing of cardiac pacemaker pulse generator [battery]: Secondary | ICD-10-CM | POA: Diagnosis not present

## 2018-01-09 NOTE — Telephone Encounter (Signed)
Called and spoke with patient's daughter. [name] states the best day and time to call her would be Tuesday January 7th around lunch time if that is good for you. No further questions or concerns at this time.

## 2018-02-20 NOTE — Telephone Encounter (Signed)
Pt has late onset alzheimers and needs more assistance. He currently lives alone. We need resources. Do we have a Education officer, museum that we could reach out too?

## 2018-02-23 NOTE — Telephone Encounter (Signed)
Ok can we call jody, his daughter and let her know referral was made and perhaps they can help [place] with resources.

## 2018-02-23 NOTE — Telephone Encounter (Signed)
He has [facility]. You can send a referral to [facility] for chronic illness palliative care/not hospice.

## 2018-02-24 NOTE — Telephone Encounter (Signed)
Daughter advised.

## 2018-02-25 DIAGNOSIS — I471 Supraventricular tachycardia: Secondary | ICD-10-CM | POA: Diagnosis not present

## 2018-02-25 DIAGNOSIS — I10 Essential (primary) hypertension: Secondary | ICD-10-CM | POA: Diagnosis not present

## 2018-02-25 DIAGNOSIS — I442 Atrioventricular block, complete: Secondary | ICD-10-CM | POA: Diagnosis not present

## 2018-02-26 ENCOUNTER — Other Ambulatory Visit: Payer: Self-pay

## 2018-02-26 ENCOUNTER — Telehealth: Payer: Self-pay | Admitting: Pharmacist

## 2018-02-26 NOTE — Patient Outreach (Signed)
[facility]) Care Management  02/26/2018  Anthony Davila 1940-11-29 [id number]   Telephone Screen  Referral Date: 02/26/2018 Referral Source: MD Office Referral Reason: " need resources with late onset Alzheimer's disease" Insurance: Medicare   Outreach attempt #1 to daughter-Jodi. No answer. RN CM left HIPAA compliant voicemail message along with contact info.     Plan: RN CM will make outreach attempt to patient within 3-4 business days. RN CM will send unsuccessful outreach letter to patient.  Enzo Montgomery, RN,BSN,CCM [facility] [place] [facility] Telephonic Care Management Coordinator Direct Phone: [phone] Toll Free: [phone] Fax: [phone]

## 2018-02-26 NOTE — Patient Outreach (Signed)
[place] [facility]) Care Management  [place]   02/26/2018  Anthony Davila 26-Jun-1940 [id number]  Reason for referral: Medication Management  Referral source: [facility] RN Current insurance:Traditional Medicare/[employer] Supplement  PMHx includes but not limited to:   DDD (cervical), hypertension, Alzheimer's disease, hyperlipidemia, GERD, and OSA (on CPAP).  Outreach:  Successful telephone call with patient's daughter Leveda Anna.  HIPAA identifiers verified.   Subjective:  [name] was called via telephone per referral to discuss her father's medications and various strategies to help him with remembering to take his medications.   Objective: Lab Results  Component Value Date   CREATININE 0.94 11/18/2017   CREATININE 1.13 10/29/2017   CREATININE 1.04 06/27/2017    Lab Results  Component Value Date   HGBA1C 5.7 04/06/2014    Lipid Panel     Component Value Date/Time   CHOL 138 06/27/2017 0933   TRIG 141 06/27/2017 0933   HDL 50 06/27/2017 0933   CHOLHDL 2.8 06/27/2017 0933   VLDL 55 (H) 03/06/2015 1055   LDLCALC 66 06/27/2017 0933    BP Readings from Last 3 Encounters:  11/26/17 120/70  10/29/17 (!) 145/81  06/24/17 135/71    No Known Allergies  Medications Reviewed Today    Reviewed by Elayne Guerin, [place] (Pharmacist) on 02/26/18 at 1228  Med List Status: <None>  Medication Order Taking? Sig Documenting Provider Last Dose Status Informant  aspirin 81 MG tablet [id number] Yes Take 81 mg by mouth daily. [provider] Taking Active   atorvastatin (LIPITOR) 20 MG tablet [id number] Yes Take 1 tablet (20 mg total) by mouth daily. Donella Stade, PA-C Taking Active   b complex vitamins tablet [id number] No Take 1 tablet by mouth daily. [provider] Not Taking Active   cholecalciferol (VITAMIN D) 1000 units tablet [id number] No Take 1 tablet (1,000 Units total) by mouth daily.  Patient not taking:  Reported on 02/26/2018   Marcial Pacas, DO Not Taking Active   donepezil (ARICEPT) 10 MG tablet [id number]  Take by mouth at bedtime.  [provider]  Expired 01/09/17 2359   DULoxetine (CYMBALTA) 60 MG capsule [id number] Yes Take 1 capsule (60 mg total) by mouth daily. Donella Stade, PA-C Taking Active   Flax Oil-Fish Oil-Borage Oil (FISH-FLAX-BORAGE) CAPS [id number] No Take 1 capsule by mouth daily.  [provider] Not Taking Active   losartan-hydrochlorothiazide (HYZAAR) 100-12.5 MG tablet [id number] Yes Take 1 tablet by mouth daily. Donella Stade, PA-C Taking Active   Melatonin 10 MG CAPS [id number] Yes Take by mouth. [provider] Taking Active   meloxicam (MOBIC) 15 MG tablet [id number] Yes One tab PO qAM with breakfast for 2 weeks, then daily prn pain. [place] Planas L, PA-C Taking Active   metoprolol tartrate (LOPRESSOR) 25 MG tablet [id number] Yes TAKE ONE-HALF TABLET BY MOUTH TWICE DAILY FOR BLOOD PRESSURE. Donella Stade, PA-C Taking Active   omeprazole (PRILOSEC) 40 MG capsule [id number] Yes Take 1 capsule (40 mg total) by mouth daily. Donella Stade, PA-C Taking Active   potassium chloride (K-DUR) 10 MEQ tablet [id number] Yes Take 1 tablet (10 mEq total) by mouth daily. Donella Stade, PA-C Taking Active   traMADol (ULTRAM) 50 MG tablet [id number] Yes TAKE 1 TABLET BY MOUTH THREE TIMES DAILY AS NEEDED MUST HAVE APPOINTMENT BEFORE ANYMORE REFILLS [provider] Taking Active           Assessment:  Drugs sorted by system:  Neurologic/Psychologic: Donepezil, Duloxetine,  Cardiovascular: Aspirin,Atorvastatin,  losartan-HCTZ, Fish Oil (not taking), Metoprolol  Gastrointestinal: Omeprazole,   Pain: Meloxicam, Tramadol  Vitamins/Minerals/Supplements: Melatonin, Potassium Chloride, B Complex Vitamin (not taking), Cholecalciferol (not taking)   Medication Assistance Findings:  No medication assistance needs identified  [name] was very interested in  deprescribing if possible to help simplify her father's medication regimen.  1.  Since he has not been taking several supplements, it is unclear if he still needs:  Fish Oil, Cholecalciferol and B-Complex  2.  He taking the following medications daily that could be switched to PRN:  meloxicam - patient's daughter reported putting meloxicam in his pill box twice daily (every day)--if deemed therapeutically appropriate, it could be dosed PRN vs in the box. (the current instructions stated the twice daily dose was for 7 days then the patient was to go back to 1 tablet daily if needed)  Omeprazole- patient is taking 40mg  daily. If there is no history of extreme GERD, GI Bleeding, etc, it may be prudent to decrease the omeprazole dose to 20mg  daily or even 20mg  daily PRN ( long term PPI use has been associated with bone mineral loss and decreased renal function.)  3.  Potassium-currently low dose unsure if patient still needs. (K is normal but that has been while on therapy. Note from 10/19 states patient was taking potassium supplementation OTC prior to the KCL 10MEq prescription being written.)  [name] was also introduced to some gadgets that might help her father remember to take his medications:        Pill packing was also briefly discussed:       Plan: Route note to provider. Follow up with patient's daughter after his appointment on 03/02/2018

## 2018-02-26 NOTE — Patient Outreach (Signed)
[facility] [place]) Care Management  02/26/2018  BOGDAN VIVONA 1940/11/14 [id number]   Telephone Screen  Referral Date: 02/26/2018 Referral Source: MD Office Referral Reason: " need resources with late onset Alzheimer's disease" Insurance: [employer] received from daughter-Jodi. Return call placed to daughter. Patient currently resides in his home alone. Daughter reports that patient's Alz. Disease is progressing and she feels it is getting to the point where patient is unsafe to live alone. Daughter assisting patient out as much as she can as she states she recently lost her job. She is interested in discussing and exploring options of possible placement vs in home support services. Daughter states that patient currently lives in a "senior living" place but states that do not assist patient. She voices that patient pays $950/month for them to provide two meals/day which he does not like and to do some light housekeeping. She states that patient makes about $1900/month and does not qualify for Medicaid. Patient's ex spouse lives in [place] and helps when she can and takes patient to appts at times. Daughter denies any recent falls. She voices patient has cane but forgets to use it most of the time. She states that patient is still able to perform his ADLs without assistance.  Per chart review, patient has PMH of late onset Alzheimer's disease,GAD, HTN, HLD, OSA, DDD, GERD and complete heart block. Daughter voices that she tries to assist patient with meds. He is taking about 10-15 meds per daughter. She does not use med planner but states she uses "baggies" to sperate meds from morning vs evening.However, daughter reports she is concerned about patient taking meds and that he is often forgetful and misses doses.   Advance Directives: Daughter reports patient has completed living will and [facility] POA. She states she is HC POA.  Consent: [facility] services reviewed and  discussed with daughter. Verbal consent for services given. Please coordinate all [facility] services via daughter-Jodi [phone] due to patient's mental status.  Plan: RN CM will send SW referral for possible placement options vs in home support/resources. RN CM will send [place] referral for polypharmacy med review and med mgmt assistance.    Enzo Montgomery, RN,BSN,CCM [facility] Telephonic Care Management Coordinator Direct Phone: [phone] Toll Free: [phone] Fax: [phone]

## 2018-03-02 ENCOUNTER — Ambulatory Visit (INDEPENDENT_AMBULATORY_CARE_PROVIDER_SITE_OTHER): Payer: Medicare Other | Admitting: Physician Assistant

## 2018-03-02 ENCOUNTER — Encounter: Payer: Self-pay | Admitting: Physician Assistant

## 2018-03-02 VITALS — BP 115/67 | HR 66 | Ht 67.5 in | Wt 176.0 lb

## 2018-03-02 DIAGNOSIS — I10 Essential (primary) hypertension: Secondary | ICD-10-CM

## 2018-03-02 DIAGNOSIS — F0280 Dementia in other diseases classified elsewhere without behavioral disturbance: Secondary | ICD-10-CM | POA: Diagnosis not present

## 2018-03-02 DIAGNOSIS — G301 Alzheimer's disease with late onset: Secondary | ICD-10-CM

## 2018-03-02 DIAGNOSIS — M5416 Radiculopathy, lumbar region: Secondary | ICD-10-CM

## 2018-03-02 DIAGNOSIS — E876 Hypokalemia: Secondary | ICD-10-CM

## 2018-03-02 MED ORDER — TRAMADOL HCL 50 MG PO TABS: ORAL_TABLET | ORAL | 2 refills | Status: DC

## 2018-03-02 NOTE — Progress Notes (Signed)
Subjective:    Patient ID: Anthony Davila, male    DOB: [birth year], 78 y.o.   MRN: [id number]  HPI  Pt is a 78 yo male with late onset alzheimer's who presents to the clinic with his daughter for 3 month follow up.   Pt is doing pretty good. He does report some left upper thigh numbness, tingling, burning intermittently. He has lumbar DDD. He is on cymbalta.  He uses tramadol as needed. He did not respond well to gabapentin in the past.   His daughter wants to go over all his medications to make sure he is taking things right. She would like to see if he would qualify for any more resources such as a different housing with more care. She worries about him being alone so much. He does not drive. He does have people who cook for him. She weekly arranges his medication.   .. Active Ambulatory Problems    Diagnosis Date Noted  . Rosacea 02/20/2015  . Essential hypertension 02/20/2015  . Hyperlipidemia 02/20/2015  . OSA on CPAP 02/20/2015  . Left lumbar radiculitis 02/20/2015  . Hypertriglyceridemia 02/20/2015  . Memory loss 02/20/2015  . Vitamin D deficiency 03/07/2015  . Generalized anxiety disorder 03/20/2015  . Neck pain 09/08/2015  . New onset of headaches after age 42 11/03/2015  . Problems of guilt 02/06/2016  . Family dynamics problem 02/06/2016  . Migraine without aura and without status migrainosus, not intractable 02/06/2016  . DDD (degenerative disc disease), cervical 06/05/2016  . 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 Late onset Alzheimer's disease without behavioral disturbance ([name]) 11/01/2016  . 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 Complete heart block ([name]) 11/01/2016  . Epigastric discomfort 11/01/2016  . Gastroesophageal reflux disease without esophagitis 11/01/2016  . Bilateral hearing loss 12/25/2016  . Jerking movements of extremities 11/03/2017  . Sensation of feeling hot 11/03/2017  . Hypokalemia 11/03/2017   Resolved Ambulatory  Problems    Diagnosis Date Noted  . Cervical spondylosis without myelopathy 09/08/2015  . Mild cognitive impairment 02/06/2016  . Cervical spondylosis with myelopathy and radiculopathy 06/05/2016   No Additional Past Medical History       Review of Systems See HPI.     Objective:   Physical Exam Vitals signs reviewed.  Constitutional:      Appearance: Normal appearance.  HENT:     Head: Normocephalic.     Right Ear: Tympanic membrane normal.     Left Ear: Tympanic membrane normal.  Cardiovascular:     Rate and Rhythm: Normal rate and regular rhythm.     Pulses: Normal pulses.  Pulmonary:     Effort: Pulmonary effort is normal.     Breath sounds: Normal breath sounds.  Skin:    Comments: No tenderness over left upper thigh.    Neurological:     General: No focal deficit present.     Mental Status: He is alert and oriented to person, place, and time.  Psychiatric:        Mood and Affect: Mood normal.           Assessment & Plan:  Marland KitchenMarland KitchenGriselda was seen today for follow-up.  Diagnoses and all orders for this visit:  Essential hypertension  Left lumbar radiculitis -     traMADol (ULTRAM) 50 MG tablet; TAKE 1 TABLET BY MOUTH TWICE a day.  Hypokalemia  reviewed medications.  Took mobic off to see if helping. Discussed he could use as needed.   BP looks  great. Continue on potassium.   Mood great.   I do think patient is likely having nerve pain in left upper thigh. He does have orthopedic which he should follow up with. He is on cymbalta. Could consider lyrica since did not respond well to gabapentin. Will give HO to discuss. It could be that injections for lumbar spine like he got for cervical spine could be a good idea. mobic as needed. Tramadol dose help.   Given exercise for lumbar back pain.   Last refill of tramadol 12/22.  Refill sent to pharmacy.  ..PDMP reviewed during this encounter.   Made referral through [facility] for resources for patient. Will  follow. Could also reach out to neurologist for more resources.   Follow up in 3 months.

## 2018-03-02 NOTE — Patient Instructions (Addendum)
Consider lyrica or follow up with ortho for back pain.  Will refill tramadol.  Pregabalin capsules What is this medicine? PREGABALIN (pre GAB a lin) is used to treat nerve pain from diabetes, shingles, spinal cord injury, and fibromyalgia. It is also used to control seizures in epilepsy. This medicine may be used for other purposes; ask your health care provider or pharmacist if you have questions. COMMON BRAND NAME(S): Lyrica What should I tell my health care provider before I take this medicine? They need to know if you have any of these conditions: -bleeding problems -heart disease, including heart failure -history of alcohol or drug abuse -kidney disease -suicidal thoughts, plans, or attempt; a previous suicide attempt by you or a family member -an unusual or allergic reaction to pregabalin, gabapentin, other medicines, foods, dyes, or preservatives -pregnant or trying to get pregnant or trying to conceive with your partner -breast-feeding How should I use this medicine? Take this medicine by mouth with a glass of water. Follow the directions on the prescription label. You can take it with or without food. If it upsets your stomach, take it with food. Take your medicine at regular intervals. Do not take it more often than directed. Do not stop taking except on your doctor's advice. A special MedGuide will be given to you by the pharmacist with each prescription and refill. Be sure to read this information carefully each time. Talk to your pediatrician regarding the use of this medicine in children. While this drug may be prescribed for children as young as 1 month for selected conditions, precautions do apply. Overdosage: If you think you have taken too much of this medicine contact a poison control center or emergency room at once. NOTE: This medicine is only for you. Do not share this medicine with others. What if I miss a dose? If you miss a dose, take it as soon as you can. If it is  almost time for your next dose, take only that dose. Do not take double or extra doses. What may interact with this medicine? -alcohol -certain medicines for blood pressure like captopril, enalapril, or lisinopril -certain medicines for diabetes, like pioglitazone or rosiglitazone -certain medicines for anxiety or sleep -narcotic medicines for pain This list may not describe all possible interactions. Give your health care provider a list of all the medicines, herbs, non-prescription drugs, or dietary supplements you use. Also tell them if you smoke, drink alcohol, or use illegal drugs. Some items may interact with your medicine. What should I watch for while using this medicine? Tell your doctor or healthcare professional if your symptoms do not start to get better or if they get worse. Visit your doctor or health care professional for regular checks on your progress. Do not stop taking except on your doctor's advice. You may develop a severe reaction. Your doctor will tell you how much medicine to take. Wear a medical identification bracelet or chain if you are taking this medicine for seizures, and carry a card that describes your disease and details of your medicine and dosage times. You may get drowsy or dizzy. Do not drive, use machinery, or do anything that needs mental alertness until you know how this medicine affects you. Do not stand or sit up quickly, especially if you are an older patient. This reduces the risk of dizzy or fainting spells. Alcohol may interfere with the effect of this medicine. Avoid alcoholic drinks. If you have a heart condition, like congestive heart failure, and notice  that you are retaining water and have swelling in your hands or feet, contact your health care provider immediately. The use of this medicine may increase the chance of suicidal thoughts or actions. Pay special attention to how you are responding while on this medicine. Any worsening of mood, or thoughts  of suicide or dying should be reported to your health care professional right away. This medicine has caused reduced sperm counts in some men. This may interfere with the ability to father a child. You should talk to your doctor or health care professional if you are concerned about your fertility. Women who become pregnant while using this medicine for seizures may enroll in the [place] Pregnancy Registry by calling (716)065-8264. This registry collects information about the safety of antiepileptic drug use during pregnancy. What side effects may I notice from receiving this medicine? Side effects that you should report to your doctor or health care professional as soon as possible: -allergic reactions like skin rash, itching or hives, swelling of the face, lips, or tongue -breathing problems -changes in vision -chest pain -confusion -jerking or unusual movements of any part of your body -loss of memory -muscle pain, tenderness, or weakness -suicidal thoughts or other mood changes -swelling of the ankles, feet, hands -unusual bruising or bleeding Side effects that usually do not require medical attention (report to your doctor or health care professional if they continue or are bothersome): -dizziness -drowsiness -dry mouth -headache -nausea -tremors -trouble sleeping -weight gain This list may not describe all possible side effects. Call your doctor for medical advice about side effects. You may report side effects to FDA at 1-800-FDA-1088. Where should I keep my medicine? Keep out of the reach of children. This medicine can be abused. Keep your medicine in a safe place to protect it from theft. Do not share this medicine with anyone. Selling or giving away this medicine is dangerous and against the law. This medicine may cause accidental overdose and death if it taken by other adults, children, or pets. Mix any unused medicine with a substance like cat litter  or coffee grounds. Then throw the medicine away in a sealed container like a sealed bag or a coffee can with a lid. Do not use the medicine after the expiration date. Store at room temperature between 15 and 30 degrees C (59 and 86 degrees F). NOTE: This sheet is a summary. It may not cover all possible information. If you have questions about this medicine, talk to your doctor, pharmacist, or health care provider.  2019 Elsevier/Gold Standard (2017-06-04 10:23:36)   Low Back Sprain Rehab Ask your health care provider which exercises are safe for you. Do exercises exactly as told by your health care provider and adjust them as directed. It is normal to feel mild stretching, pulling, tightness, or discomfort as you do these exercises, but you should stop right away if you feel sudden pain or your pain gets worse. Do not begin these exercises until told by your health care provider. Stretching and range of motion exercises These exercises warm up your muscles and joints and improve the movement and flexibility of your back. These exercises also help to relieve pain, numbness, and tingling. Exercise A: Lumbar rotation  1. Lie on your back on a firm surface and bend your knees. 2. Straighten your arms out to your sides so each arm forms an "L" shape with a side of your body (a 90 degree angle). 3. Slowly move both of  your knees to one side of your body until you feel a stretch in your lower back. Try not to let your shoulders move off of the floor. 4. Hold for __________ seconds. 5. Tense your abdominal muscles and slowly move your knees back to the starting position. 6. Repeat this exercise on the other side of your body. Repeat __________ times. Complete this exercise __________ times a day. Exercise B: Prone extension on elbows  1. Lie on your abdomen on a firm surface. 2. Prop yourself up on your elbows. 3. Use your arms to help lift your chest up until you feel a gentle stretch in your  abdomen and your lower back. ? This will place some of your body weight on your elbows. If this is uncomfortable, try stacking pillows under your chest. ? Your hips should stay down, against the surface that you are lying on. Keep your hip and back muscles relaxed. 4. Hold for __________ seconds. 5. Slowly relax your upper body and return to the starting position. Repeat __________ times. Complete this exercise __________ times a day. Strengthening exercises These exercises build strength and endurance in your back. Endurance is the ability to use your muscles for a long time, even after they get tired. Exercise C: Pelvic tilt 1. Lie on your back on a firm surface. Bend your knees and keep your feet flat. 2. Tense your abdominal muscles. Tip your pelvis up toward the ceiling and flatten your lower back into the floor. ? To help with this exercise, you may place a small towel under your lower back and try to push your back into the towel. 3. Hold for __________ seconds. 4. Let your muscles relax completely before you repeat this exercise. Repeat __________ times. Complete this exercise __________ times a day. Exercise D: Alternating arm and leg raises  1. Get on your hands and knees on a firm surface. If you are on a hard floor, you may want to use padding to cushion your knees, such as an exercise mat. 2. Line up your arms and legs. Your hands should be below your shoulders, and your knees should be below your hips. 3. Lift your left leg behind you. At the same time, raise your right arm and straighten it in front of you. ? Do not lift your leg higher than your hip. ? Do not lift your arm higher than your shoulder. ? Keep your abdominal and back muscles tight. ? Keep your hips facing the ground. ? Do not arch your back. ? Keep your balance carefully, and do not hold your breath. 4. Hold for __________ seconds. 5. Slowly return to the starting position and repeat with your right leg and  your left arm. Repeat __________ times. Complete this exercise __________ times a day. Exercise E: Abdominal set with straight leg raise  1. Lie on your back on a firm surface. 2. Bend one of your knees and keep your other leg straight. 3. Tense your abdominal muscles and lift your straight leg up, 4-6 inches (10-15 cm) off the ground. 4. Keep your abdominal muscles tight and hold for __________ seconds. ? Do not hold your breath. ? Do not arch your back. Keep it flat against the ground. 5. Keep your abdominal muscles tense as you slowly lower your leg back to the starting position. 6. Repeat with your other leg. Repeat __________ times. Complete this exercise __________ times a day. Posture and body mechanics  Body mechanics refers to the movements and positions of  your body while you do your daily activities. Posture is part of body mechanics. Good posture and healthy body mechanics can help to relieve stress in your body's tissues and joints. Good posture means that your spine is in its natural S-curve position (your spine is neutral), your shoulders are pulled back slightly, and your head is not tipped forward. The following are general guidelines for applying improved posture and body mechanics to your everyday activities. Standing   When standing, keep your spine neutral and your feet about hip-width apart. Keep a slight bend in your knees. Your ears, shoulders, and hips should line up.  When you do a task in which you stand in one place for a long time, place one foot up on a stable object that is 2-4 inches (5-10 cm) high, such as a footstool. This helps keep your spine neutral. Sitting   When sitting, keep your spine neutral and keep your feet flat on the floor. Use a footrest, if necessary, and keep your thighs parallel to the floor. Avoid rounding your shoulders, and avoid tilting your head forward.  When working at a desk or a computer, keep your desk at a height where your  hands are slightly lower than your elbows. Slide your chair under your desk so you are close enough to maintain good posture.  When working at a computer, place your monitor at a height where you are looking straight ahead and you do not have to tilt your head forward or downward to look at the screen. Resting   When lying down and resting, avoid positions that are most painful for you.  If you have pain with activities such as sitting, bending, stooping, or squatting (flexion-based activities), lie in a position in which your body does not bend very much. For example, avoid curling up on your side with your arms and knees near your chest (fetal position).  If you have pain with activities such as standing for a long time or reaching with your arms (extension-based activities), lie with your spine in a neutral position and bend your knees slightly. Try the following positions:  Lying on your side with a pillow between your knees.  Lying on your back with a pillow under your knees. Lifting   When lifting objects, keep your feet at least shoulder-width apart and tighten your abdominal muscles.  Bend your knees and hips and keep your spine neutral. It is important to lift using the strength of your legs, not your back. Do not lock your knees straight out.  Always ask for help to lift heavy or awkward objects. This information is not intended to replace advice given to you by your health care provider. Make sure you discuss any questions you have with your health care provider. Document Released: 12/24/2004 Document Revised: 08/31/2015 Document Reviewed: 10/05/2014 Elsevier Interactive Patient Education  2019 [employer].

## 2018-03-03 ENCOUNTER — Encounter: Payer: Self-pay | Admitting: *Deleted

## 2018-03-03 ENCOUNTER — Other Ambulatory Visit: Payer: Self-pay | Admitting: *Deleted

## 2018-03-03 NOTE — Patient Outreach (Signed)
[facility]) Care Management  03/03/2018  Anthony Davila 03-09-1940 [id number]   CSW made an attempt to try and contact patient's daughter, Anthony Davila today to perform the initial phone assessment on patient, as well as assess and assist with social work needs and services, without success.  A HIPAA compliant message was left for [name] on voicemail.  CSW is currently awaiting a return call.  CSW will make a second outreach attempt within the next 3-4 business days, if a return call is not received from [name] in the meantime.  CSW will also mail an Outreach Letter to patient's home requesting that [name] contact CSW if she is interested in receiving social work services through [facility] with Scientist, clinical (histocompatibility and immunogenetics).  [name], BSW, MSW, LCSW  Licensed Education officer, environmental [facility] System  Mailing [place] [address] [place], [place] [address] Physical Address-[address], [place], [place] [address] Toll Free Main # [phone] Fax # [phone] Cell # [phone]  Office # [phone] [email or web address]

## 2018-03-05 ENCOUNTER — Ambulatory Visit: Payer: Self-pay | Admitting: Pharmacist

## 2018-03-05 ENCOUNTER — Other Ambulatory Visit: Payer: Self-pay | Admitting: Pharmacist

## 2018-03-06 ENCOUNTER — Ambulatory Visit: Payer: Medicare Other | Admitting: *Deleted

## 2018-03-06 ENCOUNTER — Encounter: Payer: Self-pay | Admitting: Physician Assistant

## 2018-03-06 NOTE — Patient Outreach (Signed)
[facility]) Care Management  03/06/2018  Anthony Davila 1940/10/21 [id number]  Called patient's daughter and left a message on her voicemail to follow up from his provider visit. Unfortunately, she did not answer the phone. HIPAA compliant message was left on her voicemail.  Plan: Will call back in 5-7 business days.   Elayne Guerin, PharmD, [place] [facility] Clinical Pharmacist [phone]

## 2018-03-09 ENCOUNTER — Other Ambulatory Visit: Payer: Self-pay | Admitting: *Deleted

## 2018-03-09 ENCOUNTER — Encounter: Payer: Self-pay | Admitting: *Deleted

## 2018-03-09 NOTE — Telephone Encounter (Signed)
Thank you. We reviewed with appt in clinic and hopefully clarified medications.

## 2018-03-09 NOTE — Patient Outreach (Signed)
[facility]) Care Davila  03/09/2018  Anthony Davila 19-Nov-1940 [id number]   CSW was able to make initial contact with patient's daughter, Anthony Davila today to perform the phone assessment on patient, as well as assess and assist with social work needs and services.  CSW introduced self, explained role and types of services provided through [facility] ([facility]).  CSW further explained to Anthony Davila that CSW works with patient's RNCM, also with Anthony Davila, Anthony Davila. CSW then explained the reason for the call, indicating that Anthony Davila thought that patient and Anthony Davila would benefit from social work services and resources to assist with possible long-term care placement for patient in an assisted living facility, or assistance with arranging in-home care services.  CSW obtained two HIPAA compliant identifiers from Anthony Davila, which included patient's name and date of birth.  Anthony Davila reported that patient currently lives at [facility] [place], [place], which is a retirement community, and only minutes from where Anthony Davila currently resides.  Anthony Davila believes that this is the appropriate level of care for patient at this time, as patient is still independent with activities of daily living.  Anthony Davila went on to say that patient is currently paying $950.00 per month to live in his independent living apartment, but his rent includes two hot meals per day/7 days per week, as well as light house-keeping duties one day per week.  Anthony Davila indicated that she is able to transport patient to and from all his physician appointments, along with the assistance of her mother, patient's ex-wife.  Anthony Davila performs all of patient's grocery shopping and manages his medications by setting up his pill box each week.    Anthony Davila stated that she is an only child, going through a nasty  divorce and currently unemployed because she recently lost her job.  Anthony Davila is actively looking for work, but concerned about not being available to patient.  CSW spoke with Anthony Davila about assisting her with arranging in-home care services for patient.  Anthony Davila reported that patient is on a very fixed income, relying solely on his Social Security, and unable to afford any added expenses each month.  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  Anthony Davila indicated that she would be able to take patient to the [employer] in [place], [place], located at [address].  CSW provided Anthony Davila with the contact information [phone], encouraging her to schedule an appointment with a representative as soon as possible to complete an Aid and Attendance application.  CSW also spoke with Anthony Davila about getting patient enrolled in an Adult Day Care Program, such as ACE ([facility] [facility]) and PACE ([facility]), agreeing to mail her information about the programs.  CSW obtained Anthony Davila's address ([address] [place], [place]  [address]), indicating that CSW would be placing the information in the mail for her today.  CSW then agreed to follow-up with Anthony Davila next week, on Wednesday, March 18, 2018 at 11:00AM, to ensure that she received the packet of resource information mailed to her home, as well as answer any questions that she may have at that time.  Anthony Davila voiced understanding and was agreeable to this plan.  [employer], BSW,  MSW, LCSW  Licensed Clinical Social Worker  [place]  Mailing [place] [address], [place], [place] [address] Physical Address-[address], [place], [place]  [address] Toll Free Main # [phone] Fax # [phone] Cell # [phone]  Office # [phone] [email or web address]

## 2018-03-16 ENCOUNTER — Other Ambulatory Visit: Payer: Self-pay | Admitting: Pharmacist

## 2018-03-16 NOTE — Patient Outreach (Signed)
[facility]) Care Management  03/16/2018  VRAJ DENARDO 1940/07/18 [id number]  Called patient's daughter Leveda Anna) to follow up after her father's PCP appointment. HIPAA identifiers were obtained.    Patient is a 78 year old male with several medical conditions including but not limited to: DDD (cervical), hypertension, Alzheimer's disease, hyperlipidemia, GERD, and OSA (on CPAP).   Several medications were discontinued at the PCP appointment in an effort to simplify the patient's medication regimen:  B-complex vitamins, Cholecalciferol, Meloxicam and Omeprazole.  [name] said she is managing her father's medications with an envelope system.  She is considering a pill box with an alarm.  Patient's medications were reviewed via telephone.  Leveda Anna did express some concern about why losartan and HCTZ were filled separately last month. She was educated about the ARB drug shortage.  Patient's Medicare Part D Plan was investigated to make sure he is receiving the lowest price.   Medications Reviewed Today    Reviewed by Elayne Guerin, [facility] (Pharmacist) on 03/16/18 at 1219  Med List Status: <None>  Medication Order Taking? Sig Documenting Provider Last Dose Status Informant  aspirin 81 MG tablet [id number] Yes Take 81 mg by mouth daily. [provider] Taking Active   atorvastatin (LIPITOR) 20 MG tablet [id number] Yes Take 1 tablet (20 mg total) by mouth daily. Breeback, Jade L, PA-C Taking Active   donepezil (ARICEPT) 10 MG tablet [id number]  Take by mouth at bedtime.  [provider]  Expired 01/09/17 2359   DULoxetine (CYMBALTA) 60 MG capsule [id number] Yes Take 1 capsule (60 mg total) by mouth daily. Donella Stade, PA-C Taking Active   hydrochlorothiazide (HYDRODIURIL) 12.5 MG tablet [id number] Yes Take 12.5 mg by mouth daily. [provider] Taking Active   losartan (COZAAR) 100 MG tablet [id number] Yes Take 100 mg by mouth daily.  [provider] Taking Active   Melatonin 10 MG CAPS [id number] Yes Take by mouth. [provider] Taking Active   metoprolol tartrate (LOPRESSOR) 25 MG tablet [id number] Yes TAKE ONE-HALF TABLET BY MOUTH TWICE DAILY FOR BLOOD PRESSURE. Donella Stade, PA-C Taking Active   potassium chloride (K-DUR) 10 MEQ tablet [id number] Yes Take 1 tablet (10 mEq total) by mouth daily. Donella Stade, PA-C Taking Active   traMADol (ULTRAM) 50 MG tablet [id number] Yes TAKE 1 TABLET BY MOUTH TWICE a day. Donella Stade, PA-C Taking Active          Plan: Patient is eligible for VA benefits, Leveda Anna is planning on making an appointment to take him to the [place].  Close patient's case. His daughter has my number if she needs to reach out about medication related questions or concerns.   Elayne Guerin, PharmD, [place] Clinical Pharmacist [phone]

## 2018-03-18 ENCOUNTER — Other Ambulatory Visit: Payer: Self-pay | Admitting: *Deleted

## 2018-03-18 NOTE — Patient Outreach (Signed)
[facility]) Care Management  03/18/2018  Anthony Davila [birth year] [id number]   CSW made an attempt to try and contact patient's daughter, [name] today to follow-up regarding social work services and resources for patient; however, [name] was not available at the time of CSW's call.  CSW left a HIPAA complaint voicemail message for [name] and is currently awaiting a return call.  CSW will make a second outreach attempt within the next 3-4 business days, if a return call is not received from [name] in the meantime.  [name], BSW, MSW, LCSW  Licensed Education officer, environmental Health System  Mailing [place] [address] [place], [place] [address] Physical Address-[address], [place], [place] [address] Toll Free Main # [phone] Fax # [phone] Cell # [phone]  Office # [phone] [name]

## 2018-03-24 ENCOUNTER — Other Ambulatory Visit: Payer: Self-pay | Admitting: *Deleted

## 2018-03-24 ENCOUNTER — Encounter: Payer: Self-pay | Admitting: *Deleted

## 2018-03-24 NOTE — Patient Outreach (Signed)
[facility]) Care Management  03/24/2018  Anthony Davila 1940-03-23 [id number]   CSW made a second attempt to try and contact patient's daughter, Anthony Davila today to follow-up regarding social work services and resources for patient, as well as to ensure that she received the packet of resource information mailed to her home; however, Anthony Davila was not available at the time of CSW's call.  A HIPAA compliant message was left for Anthony Davila on voicemail and CSW continues to await a return call.  CSW will make a third and final outreach attempt within the next 3-4 business days, if a return call is not received from Anthony Davila in the meantime.  CSW will also mail an outreach letter to Anthony Davila, requesting that she contact CSW at her earliest convenience if she is still interested in receiving social work services through [place] with [facility].  CSW will then proceed with case closure if a return call is not received from Anthony Davila with a total of 10 business days, as required number of phone attempts will have been made and outreach letter mailed.   Anthony Davila, BSW, MSW, LCSW  Licensed Education officer, environmental [facility]  Mailing [place] [address] [place], [place] [address] Physical Address-[address], [place], [place] [address] Toll Free Main # [phone] Fax # [phone] Cell # [phone]  Office # [phone] [email or web address]

## 2018-03-30 ENCOUNTER — Other Ambulatory Visit: Payer: Self-pay | Admitting: *Deleted

## 2018-03-30 NOTE — Patient Outreach (Signed)
[facility]) Care Management  03/30/2018  Anthony Davila February 22, 1940 [id number]    CSW made a third and final attempt to try and contact patient's daughter, [name] today to follow-up regarding social work services and resources for patient, without success.  A HIPAA compliant message was left for [name] on voicemail.  CSW is currently awaiting a return call.  CSW will proceed with case closure in two business days, if a return call is not received in the meantime, as required number of phone attempts have been made and an outreach letter was mailed to patient's home allowing 10 business days for a response.  [name], BSW, MSW, LCSW  Licensed Education officer, environmental Health System  Mailing [place] [address], [place], [place] [address] Physical Address-[address], [place], [place] [address] Toll Free Main # [phone] Fax # [phone] Cell # [phone]  Office # [phone] [email or web address]

## 2018-04-01 ENCOUNTER — Other Ambulatory Visit: Payer: Self-pay | Admitting: *Deleted

## 2018-04-01 ENCOUNTER — Encounter: Payer: Self-pay | Admitting: *Deleted

## 2018-04-01 NOTE — Progress Notes (Signed)
Call pt's daughter and let them know case is being closed because they are not able to maintain contact.

## 2018-04-01 NOTE — Patient Outreach (Signed)
[facility]) Care Management  04/01/2018  Anthony Davila 07-Dec-1940 [id number]   CSW will perform a case closure on patient, due to inability to maintain phone contact with patient's daughter, [name], despite required number of phone attempts made and outreach letter mailed to patient's home allowing 10 business days for a response.  CSW will fax an update to patient's Primary Care Physician, Dr. Iran Planas to ensure that they are aware of CSW's involvement with patient's plan of care.  [name], BSW, MSW, LCSW  Licensed Education officer, environmental Health System  Mailing [place] [address] [place], [place] [address] Physical Address-[address], [place],  [place] [address] Toll Free Main # [phone] Fax # [phone] Cell # [phone]  Office # [phone] [email or web address]

## 2018-05-16 ENCOUNTER — Other Ambulatory Visit: Payer: Self-pay | Admitting: Physician Assistant

## 2018-05-16 DIAGNOSIS — F411 Generalized anxiety disorder: Secondary | ICD-10-CM

## 2018-05-16 DIAGNOSIS — R252 Cramp and spasm: Secondary | ICD-10-CM

## 2018-06-03 DIAGNOSIS — I429 Cardiomyopathy, unspecified: Secondary | ICD-10-CM | POA: Diagnosis not present

## 2018-06-22 ENCOUNTER — Other Ambulatory Visit: Payer: Self-pay | Admitting: Physician Assistant

## 2018-06-22 DIAGNOSIS — R252 Cramp and spasm: Secondary | ICD-10-CM

## 2018-06-22 DIAGNOSIS — F411 Generalized anxiety disorder: Secondary | ICD-10-CM

## 2018-07-05 ENCOUNTER — Other Ambulatory Visit: Payer: Self-pay | Admitting: Physician Assistant

## 2018-07-05 DIAGNOSIS — M5416 Radiculopathy, lumbar region: Secondary | ICD-10-CM

## 2018-07-06 ENCOUNTER — Telehealth: Payer: Self-pay | Admitting: Physician Assistant

## 2018-07-06 DIAGNOSIS — F411 Generalized anxiety disorder: Secondary | ICD-10-CM

## 2018-07-06 DIAGNOSIS — M5416 Radiculopathy, lumbar region: Secondary | ICD-10-CM

## 2018-07-06 DIAGNOSIS — R252 Cramp and spasm: Secondary | ICD-10-CM

## 2018-07-06 MED ORDER — DULOXETINE HCL 60 MG PO CPEP: ORAL_CAPSULE | ORAL | 0 refills | Status: DC

## 2018-07-06 MED ORDER — TRAMADOL HCL 50 MG PO TABS: ORAL_TABLET | ORAL | 0 refills | Status: DC

## 2018-07-06 NOTE — Telephone Encounter (Signed)
Pt's daughter advised.  

## 2018-07-06 NOTE — Telephone Encounter (Signed)
Patient has appt 07/07/2018. Please advise.

## 2018-07-06 NOTE — Telephone Encounter (Signed)
Ok sent to make it to next appt.

## 2018-07-06 NOTE — Telephone Encounter (Signed)
Daughter calling in stating that patient is almost out of the DULoxetine (CYMBALTA) 60 MG capsule [[id number]] and the traMADol (ULTRAM) 50 MG tablet [[id number]]. Appointment has been rescheduled for when PCP comes back. Please contact and advise once competed.

## 2018-07-07 ENCOUNTER — Ambulatory Visit: Payer: Medicare Other | Admitting: Physician Assistant

## 2018-07-21 ENCOUNTER — Ambulatory Visit (INDEPENDENT_AMBULATORY_CARE_PROVIDER_SITE_OTHER): Payer: Medicare Other | Admitting: Physician Assistant

## 2018-07-21 ENCOUNTER — Encounter: Payer: Self-pay | Admitting: Physician Assistant

## 2018-07-21 ENCOUNTER — Other Ambulatory Visit: Payer: Self-pay

## 2018-07-21 VITALS — BP 117/60 | HR 78 | Ht 67.75 in | Wt 177.0 lb

## 2018-07-21 DIAGNOSIS — M25511 Pain in right shoulder: Secondary | ICD-10-CM | POA: Diagnosis not present

## 2018-07-21 DIAGNOSIS — E876 Hypokalemia: Secondary | ICD-10-CM | POA: Diagnosis not present

## 2018-07-21 DIAGNOSIS — M5416 Radiculopathy, lumbar region: Secondary | ICD-10-CM

## 2018-07-21 DIAGNOSIS — G8929 Other chronic pain: Secondary | ICD-10-CM

## 2018-07-21 DIAGNOSIS — E782 Mixed hyperlipidemia: Secondary | ICD-10-CM

## 2018-07-21 DIAGNOSIS — F411 Generalized anxiety disorder: Secondary | ICD-10-CM

## 2018-07-21 DIAGNOSIS — R252 Cramp and spasm: Secondary | ICD-10-CM | POA: Diagnosis not present

## 2018-07-21 MED ORDER — TRAMADOL HCL 50 MG PO TABS: ORAL_TABLET | ORAL | 0 refills | Status: DC

## 2018-07-21 MED ORDER — DICLOFENAC SODIUM 1 % TD GEL: 4.0000 g | Freq: Four times a day (QID) | TRANSDERMAL | 1 refills | Status: AC

## 2018-07-21 MED ORDER — POTASSIUM CHLORIDE ER 10 MEQ PO TBCR: 10.0000 meq | EXTENDED_RELEASE_TABLET | Freq: Every day | ORAL | 1 refills | Status: DC

## 2018-07-21 MED ORDER — DULOXETINE HCL 60 MG PO CPEP: ORAL_CAPSULE | ORAL | 1 refills | Status: DC

## 2018-07-21 MED ORDER — METHYLPREDNISOLONE ACETATE 40 MG/ML IJ SUSP
40.0000 mg | Freq: Once | INTRAMUSCULAR | Status: AC
  Administered 2018-07-21: 40 mg via INTRAMUSCULAR

## 2018-07-21 MED ORDER — ATORVASTATIN CALCIUM 20 MG PO TABS: 20.0000 mg | ORAL_TABLET | Freq: Every day | ORAL | 1 refills | Status: DC

## 2018-07-21 NOTE — Patient Instructions (Signed)
Shoulder Impingement Syndrome Rehab Ask your health care provider which exercises are safe for you. Do exercises exactly as told by your health care provider and adjust them as directed. It is normal to feel mild stretching, pulling, tightness, or discomfort as you do these exercises. Stop right away if you feel sudden pain or your pain gets worse. Do not begin these exercises until told by your health care provider. Stretching and range-of-motion exercise This exercise warms up your muscles and joints and improves the movement and flexibility of your shoulder. This exercise also helps to relieve pain and stiffness. Passive horizontal adduction In passive adduction, you use your other hand to move the injured arm toward your body. The injured arm does not move on its own. In this movement, your arm is moved across your body in the horizontal plane (horizontal adduction). 1. Sit or stand and pull your left / right elbow across your chest, toward your other shoulder. Stop when you feel a gentle stretch in the back of your shoulder and upper arm. ? Keep your arm at shoulder height. ? Keep your arm as close to your body as you comfortably can. 2. Hold for __________ seconds. 3. Slowly return to the starting position. Repeat __________ times. Complete this exercise __________ times a day. Strengthening exercises These exercises build strength and endurance in your shoulder. Endurance is the ability to use your muscles for a long time, even after they get tired. External rotation, isometric This is an exercise in which you press the back of your wrist against a door frame without moving your shoulder joint (isometric). 1. Stand or sit in a doorway, facing the door frame. 2. Bend your left / right elbow and place the back of your wrist against the door frame. Only the back of your wrist should be touching the frame. Keep your upper arm at your side. 3. Gently press your wrist against the door frame, as if  you are trying to push your arm away from your abdomen (external rotation). Press as hard as you are able without pain. ? Avoid shrugging your shoulder while you press your wrist against the door frame. Keep your shoulder blade tucked down toward the middle of your back. 4. Hold for __________ seconds. 5. Slowly release the tension, and relax your muscles completely before you repeat the exercise. Repeat __________ times. Complete this exercise __________ times a day. Internal rotation, isometric This is an exercise in which you press your palm against a door frame without moving your shoulder joint (isometric). 1. Stand or sit in a doorway, facing the door frame. 2. Bend your left / right elbow and place the palm of your hand against the door frame. Only your palm should be touching the frame. Keep your upper arm at your side. 3. Gently press your hand against the door frame, as if you are trying to push your arm toward your abdomen (internal rotation). Press as hard as you are able without pain. ? Avoid shrugging your shoulder while you press your hand against the door frame. Keep your shoulder blade tucked down toward the middle of your back. 4. Hold for __________ seconds. 5. Slowly release the tension, and relax your muscles completely before you repeat the exercise. Repeat __________ times. Complete this exercise __________ times a day. Scapular protraction, supine  1. Lie on your back on a firm surface (supine position). Hold a __________ weight in your left / right hand. 2. Raise your left / right arm straight   into the air so your hand is directly above your shoulder joint. 3. Push the weight into the air so your shoulder (scapula) lifts off the surface that you are lying on. The scapula will push up or forward (protraction). Do not move your head, neck, or back. 4. Hold for __________ seconds. 5. Slowly return to the starting position. Let your muscles relax completely before you repeat  this exercise. Repeat __________ times. Complete this exercise __________ times a day. Scapular retraction  1. Sit in a stable chair without armrests, or stand up. 2. Secure an exercise band to a stable object in front of you so the band is at shoulder height. 3. Hold one end of the exercise band in each hand. Your palms should face down. 4. Squeeze your shoulder blades together (retraction) and move your elbows slightly behind you. Do not shrug your shoulders upward while you do this. 5. Hold for __________ seconds. 6. Slowly return to the starting position. Repeat __________ times. Complete this exercise __________ times a day. Shoulder extension  1. Sit in a stable chair without armrests, or stand up. 2. Secure an exercise band to a stable object in front of you so the band is above shoulder height. 3. Hold one end of the exercise band in each hand. 4. Straighten your elbows and lift your hands up to shoulder height. 5. Squeeze your shoulder blades together and pull your hands down to the sides of your thighs (extension). Stop when your hands are straight down by your sides. Do not let your hands go behind your body. 6. Hold for __________ seconds. 7. Slowly return to the starting position. Repeat __________ times. Complete this exercise __________ times a day. This information is not intended to replace advice given to you by your health care provider. Make sure you discuss any questions you have with your health care provider. Document Released: 12/24/2004 Document Revised: 04/17/2018 Document Reviewed: 01/19/2018 Elsevier Patient Education  2020 [employer].

## 2018-07-21 NOTE — Progress Notes (Signed)
Subjective:    Patient ID: Anthony Davila, male    DOB: [birth year], 78 y.o.   MRN: [id number]  HPI  Pt is a 78 yo male with late onset alzheimers and accompanied by daughter. PMHx of HTN, GAD, OSA, cervical DDD, headaches.    He needs medications refilled today. He is doing pretty good but complains of ongoing pain in right shoulder today. Pain going on for the last 6 months or more.  Pain is actually in both shoulders seems to be worse in right shoulder today. He has had mobic in the past but not taken anything recently. No known injury.    .. Active Ambulatory Problems    Diagnosis Date Noted  . Rosacea 02/20/2015  . Essential hypertension 02/20/2015  . Hyperlipidemia 02/20/2015  . OSA on CPAP 02/20/2015  . Left lumbar radiculitis 02/20/2015  . Hypertriglyceridemia 02/20/2015  . Memory loss 02/20/2015  . Vitamin D deficiency 03/07/2015  . Generalized anxiety disorder 03/20/2015  . Neck pain 09/08/2015  . New onset of headaches after age 65 11/03/2015  . Problems of guilt 02/06/2016  . Family dynamics problem 02/06/2016  . Migraine without aura and without status migrainosus, not intractable 02/06/2016  . DDD (degenerative disc disease), cervical 06/05/2016  . 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 Late onset Alzheimer's disease without behavioral disturbance (Anthony Davila) 11/01/2016  . 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 Complete heart block (Anthony Davila) 11/01/2016  . Epigastric discomfort 11/01/2016  . Gastroesophageal reflux disease without esophagitis 11/01/2016  . Bilateral hearing loss 12/25/2016  . Hypokalemia 11/03/2017  . Chronic right shoulder pain 07/24/2018   Resolved Ambulatory Problems    Diagnosis Date Noted  . Cervical spondylosis without myelopathy 09/08/2015  . Mild cognitive impairment 02/06/2016  . Cervical spondylosis with myelopathy and radiculopathy 06/05/2016  . Jerking movements of extremities 11/03/2017  . Sensation of feeling hot  11/03/2017   No Additional Past Medical History      Review of Systems  All other systems reviewed and are negative.      Objective:   Physical Exam Vitals signs reviewed.  Constitutional:      Appearance: Normal appearance.  HENT:     Head: Normocephalic.  Cardiovascular:     Rate and Rhythm: Normal rate and regular rhythm.  Pulmonary:     Effort: Pulmonary effort is normal.     Breath sounds: Normal breath sounds.  Musculoskeletal:     Comments: Right shoulder: Tenderness to palpation posterior shoulder.  ROM limited to abduction due to pain.   Negative drop arm.  Strength 5/5 bilaterally.  Pain with internal and external rotation.   Neurological:     General: No focal deficit present.     Mental Status: He is alert and oriented to person, place, and time.  Psychiatric:        Mood and Affect: Mood normal.        Behavior: Behavior normal.     Comments: Wandering thoughts.            Assessment & Plan:  Marland KitchenMarland KitchenGaylon was seen today for muscle pain and hypertension.  Diagnoses and all orders for this visit:  Chronic right shoulder pain -     diclofenac sodium (VOLTAREN) 1 % GEL; Apply 4 g topically 4 (four) times daily. To affected joint. -     methylPREDNISolone acetate (DEPO-MEDROL) injection 40 mg  Left lumbar radiculitis -     traMADol (ULTRAM) 50 MG tablet; TAKE 1 TABLET BY MOUTH TWICE a  day. -     diclofenac sodium (VOLTAREN) 1 % GEL; Apply 4 g topically 4 (four) times daily. To affected joint.  Generalized anxiety disorder -     DULoxetine (CYMBALTA) 60 MG capsule; TAKE 1 CAPSULE BY MOUTH ONCE DAILY  Jerking movements of extremities -     DULoxetine (CYMBALTA) 60 MG capsule; TAKE 1 CAPSULE BY MOUTH ONCE DAILY  Hypokalemia -     potassium chloride (K-DUR) 10 MEQ tablet; Take 1 tablet (10 mEq total) by mouth daily.  Mixed hyperlipidemia -     atorvastatin (LIPITOR) 20 MG tablet; Take 1 tablet (20 mg total) by mouth daily.   Labs up to date for  now.  Refilled medication.   Sounds like impingement syndrome/rotator cuff tendonitis.  Discussed icy hot patches.  Encouraged stretches.    Shoulder Injection Procedure Note  Pre-operative Diagnosis: right shoulder pain  Post-operative Diagnosis: right shoulder pain  Indications: pain  Anesthesia: ethyl chloride  Procedure Details   Verbal consent was obtained for the procedure. The shoulder was prepped with iodine and the skin was anesthetized. Using a 22 gauge needle the glenohumeral joint is injected with 9 mL 1% lidocaine and 1 mL of depo medrol 40mg  under the posterior aspect of the acromion. The injection site was cleansed with topical isopropyl alcohol and a dressing was applied.  Complications:  None; patient tolerated the procedure well.  Will see if injection helps. Consider xray and sports medicine visit if not improving. If helps can do other shoulder as well.

## 2018-07-24 DIAGNOSIS — M25511 Pain in right shoulder: Secondary | ICD-10-CM | POA: Insufficient documentation

## 2018-07-24 DIAGNOSIS — G8929 Other chronic pain: Secondary | ICD-10-CM | POA: Insufficient documentation

## 2018-09-02 DIAGNOSIS — I442 Atrioventricular block, complete: Secondary | ICD-10-CM | POA: Diagnosis not present

## 2018-09-06 ENCOUNTER — Other Ambulatory Visit: Payer: Self-pay | Admitting: Physician Assistant

## 2018-09-06 DIAGNOSIS — M5416 Radiculopathy, lumbar region: Secondary | ICD-10-CM

## 2018-10-03 ENCOUNTER — Other Ambulatory Visit: Payer: Self-pay | Admitting: Physician Assistant

## 2018-10-03 DIAGNOSIS — M5416 Radiculopathy, lumbar region: Secondary | ICD-10-CM

## 2018-10-05 NOTE — Telephone Encounter (Signed)
Last RF written 09/07/18 for #30 day supply  Last OV 07/21/18  RX pended

## 2018-11-07 ENCOUNTER — Other Ambulatory Visit: Payer: Self-pay | Admitting: Physician Assistant

## 2018-11-07 DIAGNOSIS — M5416 Radiculopathy, lumbar region: Secondary | ICD-10-CM

## 2018-11-18 DIAGNOSIS — F22 Delusional disorders: Secondary | ICD-10-CM | POA: Diagnosis not present

## 2018-11-18 DIAGNOSIS — G301 Alzheimer's disease with late onset: Secondary | ICD-10-CM | POA: Diagnosis not present

## 2018-11-18 DIAGNOSIS — F0281 Dementia in other diseases classified elsewhere with behavioral disturbance: Secondary | ICD-10-CM | POA: Diagnosis not present

## 2018-11-18 DIAGNOSIS — I10 Essential (primary) hypertension: Secondary | ICD-10-CM | POA: Diagnosis not present

## 2018-11-21 ENCOUNTER — Other Ambulatory Visit: Payer: Self-pay | Admitting: Physician Assistant

## 2018-11-21 DIAGNOSIS — I10 Essential (primary) hypertension: Secondary | ICD-10-CM

## 2018-11-23 NOTE — Telephone Encounter (Signed)
Needs Appointment

## 2018-11-25 DIAGNOSIS — E785 Hyperlipidemia, unspecified: Secondary | ICD-10-CM | POA: Diagnosis not present

## 2018-11-25 DIAGNOSIS — Z602 Problems related to living alone: Secondary | ICD-10-CM | POA: Diagnosis not present

## 2018-11-25 DIAGNOSIS — F0281 Dementia in other diseases classified elsewhere with behavioral disturbance: Secondary | ICD-10-CM | POA: Diagnosis not present

## 2018-11-25 DIAGNOSIS — G301 Alzheimer's disease with late onset: Secondary | ICD-10-CM | POA: Diagnosis not present

## 2018-11-25 DIAGNOSIS — K5790 Diverticulosis of intestine, part unspecified, without perforation or abscess without bleeding: Secondary | ICD-10-CM | POA: Diagnosis not present

## 2018-11-25 DIAGNOSIS — Z9181 History of falling: Secondary | ICD-10-CM | POA: Diagnosis not present

## 2018-11-25 DIAGNOSIS — G4733 Obstructive sleep apnea (adult) (pediatric): Secondary | ICD-10-CM | POA: Diagnosis not present

## 2018-11-25 DIAGNOSIS — I10 Essential (primary) hypertension: Secondary | ICD-10-CM | POA: Diagnosis not present

## 2018-11-25 DIAGNOSIS — K279 Peptic ulcer, site unspecified, unspecified as acute or chronic, without hemorrhage or perforation: Secondary | ICD-10-CM | POA: Diagnosis not present

## 2018-11-25 DIAGNOSIS — E291 Testicular hypofunction: Secondary | ICD-10-CM | POA: Diagnosis not present

## 2018-11-25 DIAGNOSIS — L821 Other seborrheic keratosis: Secondary | ICD-10-CM | POA: Diagnosis not present

## 2018-11-25 DIAGNOSIS — Z950 Presence of cardiac pacemaker: Secondary | ICD-10-CM | POA: Diagnosis not present

## 2018-11-25 DIAGNOSIS — I442 Atrioventricular block, complete: Secondary | ICD-10-CM | POA: Diagnosis not present

## 2018-11-25 DIAGNOSIS — Z85828 Personal history of other malignant neoplasm of skin: Secondary | ICD-10-CM | POA: Diagnosis not present

## 2018-11-25 DIAGNOSIS — K648 Other hemorrhoids: Secondary | ICD-10-CM | POA: Diagnosis not present

## 2018-12-01 DIAGNOSIS — Z602 Problems related to living alone: Secondary | ICD-10-CM | POA: Diagnosis not present

## 2018-12-01 DIAGNOSIS — I442 Atrioventricular block, complete: Secondary | ICD-10-CM | POA: Diagnosis not present

## 2018-12-01 DIAGNOSIS — G4733 Obstructive sleep apnea (adult) (pediatric): Secondary | ICD-10-CM | POA: Diagnosis not present

## 2018-12-01 DIAGNOSIS — G301 Alzheimer's disease with late onset: Secondary | ICD-10-CM | POA: Diagnosis not present

## 2018-12-01 DIAGNOSIS — I10 Essential (primary) hypertension: Secondary | ICD-10-CM | POA: Diagnosis not present

## 2018-12-01 DIAGNOSIS — F0281 Dementia in other diseases classified elsewhere with behavioral disturbance: Secondary | ICD-10-CM | POA: Diagnosis not present

## 2018-12-02 DIAGNOSIS — G301 Alzheimer's disease with late onset: Secondary | ICD-10-CM | POA: Diagnosis not present

## 2018-12-02 DIAGNOSIS — Z602 Problems related to living alone: Secondary | ICD-10-CM | POA: Diagnosis not present

## 2018-12-02 DIAGNOSIS — G4733 Obstructive sleep apnea (adult) (pediatric): Secondary | ICD-10-CM | POA: Diagnosis not present

## 2018-12-02 DIAGNOSIS — F0281 Dementia in other diseases classified elsewhere with behavioral disturbance: Secondary | ICD-10-CM | POA: Diagnosis not present

## 2018-12-02 DIAGNOSIS — I10 Essential (primary) hypertension: Secondary | ICD-10-CM | POA: Diagnosis not present

## 2018-12-02 DIAGNOSIS — I442 Atrioventricular block, complete: Secondary | ICD-10-CM | POA: Diagnosis not present

## 2018-12-05 DIAGNOSIS — Z602 Problems related to living alone: Secondary | ICD-10-CM | POA: Diagnosis not present

## 2018-12-05 DIAGNOSIS — I10 Essential (primary) hypertension: Secondary | ICD-10-CM | POA: Diagnosis not present

## 2018-12-05 DIAGNOSIS — I442 Atrioventricular block, complete: Secondary | ICD-10-CM | POA: Diagnosis not present

## 2018-12-05 DIAGNOSIS — F0281 Dementia in other diseases classified elsewhere with behavioral disturbance: Secondary | ICD-10-CM | POA: Diagnosis not present

## 2018-12-05 DIAGNOSIS — G4733 Obstructive sleep apnea (adult) (pediatric): Secondary | ICD-10-CM | POA: Diagnosis not present

## 2018-12-05 DIAGNOSIS — G301 Alzheimer's disease with late onset: Secondary | ICD-10-CM | POA: Diagnosis not present

## 2018-12-07 DIAGNOSIS — I10 Essential (primary) hypertension: Secondary | ICD-10-CM | POA: Diagnosis not present

## 2018-12-07 DIAGNOSIS — F0281 Dementia in other diseases classified elsewhere with behavioral disturbance: Secondary | ICD-10-CM | POA: Diagnosis not present

## 2018-12-07 DIAGNOSIS — G4733 Obstructive sleep apnea (adult) (pediatric): Secondary | ICD-10-CM | POA: Diagnosis not present

## 2018-12-07 DIAGNOSIS — I442 Atrioventricular block, complete: Secondary | ICD-10-CM | POA: Diagnosis not present

## 2018-12-07 DIAGNOSIS — Z602 Problems related to living alone: Secondary | ICD-10-CM | POA: Diagnosis not present

## 2018-12-07 DIAGNOSIS — G301 Alzheimer's disease with late onset: Secondary | ICD-10-CM | POA: Diagnosis not present

## 2018-12-09 ENCOUNTER — Ambulatory Visit (INDEPENDENT_AMBULATORY_CARE_PROVIDER_SITE_OTHER): Payer: Medicare Other | Admitting: Physician Assistant

## 2018-12-09 DIAGNOSIS — I10 Essential (primary) hypertension: Secondary | ICD-10-CM | POA: Diagnosis not present

## 2018-12-09 DIAGNOSIS — I442 Atrioventricular block, complete: Secondary | ICD-10-CM | POA: Diagnosis not present

## 2018-12-09 DIAGNOSIS — Z5329 Procedure and treatment not carried out because of patient's decision for other reasons: Secondary | ICD-10-CM

## 2018-12-09 DIAGNOSIS — Z602 Problems related to living alone: Secondary | ICD-10-CM | POA: Diagnosis not present

## 2018-12-09 DIAGNOSIS — G301 Alzheimer's disease with late onset: Secondary | ICD-10-CM | POA: Diagnosis not present

## 2018-12-09 DIAGNOSIS — F0281 Dementia in other diseases classified elsewhere with behavioral disturbance: Secondary | ICD-10-CM | POA: Diagnosis not present

## 2018-12-09 DIAGNOSIS — G4733 Obstructive sleep apnea (adult) (pediatric): Secondary | ICD-10-CM | POA: Diagnosis not present

## 2018-12-09 NOTE — Progress Notes (Signed)
No show

## 2018-12-10 DIAGNOSIS — I442 Atrioventricular block, complete: Secondary | ICD-10-CM | POA: Diagnosis not present

## 2018-12-10 DIAGNOSIS — G301 Alzheimer's disease with late onset: Secondary | ICD-10-CM | POA: Diagnosis not present

## 2018-12-10 DIAGNOSIS — G4733 Obstructive sleep apnea (adult) (pediatric): Secondary | ICD-10-CM | POA: Diagnosis not present

## 2018-12-10 DIAGNOSIS — F0281 Dementia in other diseases classified elsewhere with behavioral disturbance: Secondary | ICD-10-CM | POA: Diagnosis not present

## 2018-12-10 DIAGNOSIS — I10 Essential (primary) hypertension: Secondary | ICD-10-CM | POA: Diagnosis not present

## 2018-12-10 DIAGNOSIS — Z602 Problems related to living alone: Secondary | ICD-10-CM | POA: Diagnosis not present

## 2018-12-14 DIAGNOSIS — G4733 Obstructive sleep apnea (adult) (pediatric): Secondary | ICD-10-CM | POA: Diagnosis not present

## 2018-12-14 DIAGNOSIS — Z602 Problems related to living alone: Secondary | ICD-10-CM | POA: Diagnosis not present

## 2018-12-14 DIAGNOSIS — G301 Alzheimer's disease with late onset: Secondary | ICD-10-CM | POA: Diagnosis not present

## 2018-12-14 DIAGNOSIS — I442 Atrioventricular block, complete: Secondary | ICD-10-CM | POA: Diagnosis not present

## 2018-12-14 DIAGNOSIS — I10 Essential (primary) hypertension: Secondary | ICD-10-CM | POA: Diagnosis not present

## 2018-12-14 DIAGNOSIS — F0281 Dementia in other diseases classified elsewhere with behavioral disturbance: Secondary | ICD-10-CM | POA: Diagnosis not present

## 2018-12-15 ENCOUNTER — Other Ambulatory Visit: Payer: Self-pay

## 2018-12-15 ENCOUNTER — Ambulatory Visit (INDEPENDENT_AMBULATORY_CARE_PROVIDER_SITE_OTHER): Payer: Medicare Other | Admitting: Physician Assistant

## 2018-12-15 VITALS — BP 118/70 | HR 107 | Ht 67.75 in | Wt 175.0 lb

## 2018-12-15 DIAGNOSIS — Z9989 Dependence on other enabling machines and devices: Secondary | ICD-10-CM

## 2018-12-15 DIAGNOSIS — E782 Mixed hyperlipidemia: Secondary | ICD-10-CM | POA: Diagnosis not present

## 2018-12-15 DIAGNOSIS — I10 Essential (primary) hypertension: Secondary | ICD-10-CM | POA: Diagnosis not present

## 2018-12-15 DIAGNOSIS — M25511 Pain in right shoulder: Secondary | ICD-10-CM

## 2018-12-15 DIAGNOSIS — I442 Atrioventricular block, complete: Secondary | ICD-10-CM

## 2018-12-15 DIAGNOSIS — G4733 Obstructive sleep apnea (adult) (pediatric): Secondary | ICD-10-CM

## 2018-12-15 DIAGNOSIS — Z23 Encounter for immunization: Secondary | ICD-10-CM | POA: Diagnosis not present

## 2018-12-15 DIAGNOSIS — G8929 Other chronic pain: Secondary | ICD-10-CM

## 2018-12-15 DIAGNOSIS — G301 Alzheimer's disease with late onset: Secondary | ICD-10-CM | POA: Diagnosis not present

## 2018-12-15 DIAGNOSIS — G43009 Migraine without aura, not intractable, without status migrainosus: Secondary | ICD-10-CM | POA: Diagnosis not present

## 2018-12-15 DIAGNOSIS — F0280 Dementia in other diseases classified elsewhere without behavioral disturbance: Secondary | ICD-10-CM | POA: Diagnosis not present

## 2018-12-15 DIAGNOSIS — E876 Hypokalemia: Secondary | ICD-10-CM

## 2018-12-15 MED ORDER — ATORVASTATIN CALCIUM 20 MG PO TABS: 20.0000 mg | ORAL_TABLET | Freq: Every day | ORAL | 3 refills | Status: AC

## 2018-12-15 MED ORDER — POTASSIUM CHLORIDE ER 10 MEQ PO TBCR: 10.0000 meq | EXTENDED_RELEASE_TABLET | Freq: Every day | ORAL | 1 refills | Status: AC

## 2018-12-15 MED ORDER — METOPROLOL TARTRATE 25 MG PO TABS: ORAL_TABLET | ORAL | 1 refills | Status: AC

## 2018-12-15 MED ORDER — METHYLPREDNISOLONE ACETATE 40 MG/ML IJ SUSP
40.0000 mg | Freq: Once | INTRAMUSCULAR | Status: AC
  Administered 2018-12-15: 40 mg via INTRAMUSCULAR

## 2018-12-15 MED ORDER — LOSARTAN POTASSIUM-HCTZ 50-12.5 MG PO TABS: 1.0000 | ORAL_TABLET | Freq: Every day | ORAL | 3 refills | Status: AC

## 2018-12-15 NOTE — Progress Notes (Signed)
Subjective:    Patient ID: Anthony Davila, male    DOB: 02-20-1940, 78 y.o.   MRN: [id number]  HPI  Pt is a 78 yo male with late onset alzheimers disease, HTN, GAD, chronic right shoulder pain who presents to the clinic for medication follow up. Pt is accompanied by his ex-wife who helps to care for him.   They come to get new CPAP and supplies. Last sleep test was 2014 but do not have copy. He uses it every night for most of the night.   He recently took 48 hours worth of all his medications. Memory care sent note to simply medication regimen. Suggested he received more care with medication.   He complains of right shoulder pain. He got an injection in it in July 2020. It did help. No new injury. No imaging done.    Active Ambulatory Problems    Diagnosis Date Noted  . Rosacea 02/20/2015  . Essential hypertension 02/20/2015  . Hyperlipidemia 02/20/2015  . OSA on CPAP 02/20/2015  . Left lumbar radiculitis 02/20/2015  . Hypertriglyceridemia 02/20/2015  . Memory loss 02/20/2015  . Vitamin D deficiency 03/07/2015  . Generalized anxiety disorder 03/20/2015  . Neck pain 09/08/2015  . New onset of headaches after age 73 11/03/2015  . Problems of guilt 02/06/2016  . Family dynamics problem 02/06/2016  . Migraine without aura and without status migrainosus, not intractable 02/06/2016  . DDD (degenerative disc disease), cervical 06/05/2016  . 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 Late onset Alzheimer's disease without behavioral disturbance (Anthony Davila) 11/01/2016  . 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 Complete heart block ([place]) 11/01/2016  . Epigastric discomfort 11/01/2016  . Gastroesophageal reflux disease without esophagitis 11/01/2016  . Bilateral hearing loss 12/25/2016  . Hypokalemia 11/03/2017  . Chronic right shoulder pain 07/24/2018   Resolved Ambulatory Problems    Diagnosis Date Noted  . Cervical spondylosis without myelopathy 09/08/2015  . Mild  cognitive impairment 02/06/2016  . Cervical spondylosis with myelopathy and radiculopathy 06/05/2016  . Jerking movements of extremities 11/03/2017  . Sensation of feeling hot 11/03/2017   No Additional Past Medical History         Review of Systems See HPI.     Objective:   Physical Exam Constitutional:      Appearance: Normal appearance. He is well-developed.  Cardiovascular:     Rate and Rhythm: Normal rate and regular rhythm.  Pulmonary:     Effort: Pulmonary effort is normal.     Breath sounds: Normal breath sounds.  Musculoskeletal:     Right shoulder: Tenderness present. No swelling, deformity, effusion or crepitus. Decreased range of motion. Normal strength.     Comments: Right shoulder pain with external and internal rROM.   Neurological:     Mental Status: He is alert.  Psychiatric:        Behavior: Behavior is cooperative.           Assessment & Plan:  Marland KitchenMarland KitchenGaylon was seen today for sleep apnea.  Diagnoses and all orders for this visit:  Chronic right shoulder pain -     methylPREDNISolone acetate (DEPO-MEDROL) injection 40 mg  Complete heart block (HCC) -     CBC  Essential hypertension -     losartan-hydrochlorothiazide (HYZAAR) 50-12.5 MG tablet; Take 1 tablet by mouth daily. -     metoprolol tartrate (LOPRESSOR) 25 MG tablet; TAKE ONE-HALF TABLET BY MOUTH TWICE DAILY FOR BLOOD PRESSURE. -     COMPLETE METABOLIC PANEL WITH  GFR  Migraine without aura and without status migrainosus, not intractable  Late onset Alzheimer's disease without behavioral disturbance (HCC) -     COMPLETE METABOLIC PANEL WITH GFR -     CBC  Mixed hyperlipidemia -     atorvastatin (LIPITOR) 20 MG tablet; Take 1 tablet (20 mg total) by mouth daily. -     Lipid Panel w/reflex Direct LDL  Hypokalemia -     COMPLETE METABOLIC PANEL WITH GFR -     potassium chloride (KLOR-CON) 10 MEQ tablet; Take 1 tablet (10 mEq total) by mouth daily.  Flu vaccine need -     HIGH DOSE  FLU  OSA on CPAP   BP looks great. Medication refilled.  Need labs. Continue potassium for now.   Needs new CPAP. Cannot find old study. Ordered home sleep study through [facility] and [facility].   Pt declined xray of shoulder today.use diclofenac and icy hot patches. suspect some bursitis or arthritis. Good strength and negative drop arm sign. Consider PT but would struggle to find a ride. If shot does not help consider xray and referral. Given exercises.   Subacromial Shoulder Injection Procedure Note  Pre-operative Diagnosis: right shoulder  Post-operative Diagnosis: right shoulder  Indications: pain and loss of rOM  Procedure Details   Verbal consent was obtained for the procedure. The shoulder was prepped with Betadine and the skin was anesthetized. A 27 gauge needle was advanced into the subacromial space through posterior approach without difficulty  The space was then injected with 9 ml 1% lidocaine and 1 ml of depo medrol 40mg . The injection site was cleansed with isopropyl alcohol and a dressing was applied.  Complications:  None; patient tolerated the procedure well.   I did receive a note from memory care that would like for me to simplify his medications.  He has a incident on 12/02/2018 where he took 48 hours worth of his medications.  Their suggestions include discontinuing Aricept and possibly metoprolol.  Stopped aricept. Stop tramadol.  Continue BP medication.  They are considering moving him closer to ex-wife so she can give medication. They cannot afford nursing facility.   Follow up in 6 months.

## 2018-12-16 ENCOUNTER — Other Ambulatory Visit: Payer: Self-pay | Admitting: Neurology

## 2018-12-16 ENCOUNTER — Encounter: Payer: Self-pay | Admitting: Physician Assistant

## 2018-12-16 DIAGNOSIS — N289 Disorder of kidney and ureter, unspecified: Secondary | ICD-10-CM

## 2018-12-16 DIAGNOSIS — R748 Abnormal levels of other serum enzymes: Secondary | ICD-10-CM

## 2018-12-16 LAB — CBC
HCT: 42.6 % (ref 38.5–50.0)
Hemoglobin: 14.8 g/dL (ref 13.2–17.1)
MCH: 33 pg (ref 27.0–33.0)
MCHC: 34.7 g/dL (ref 32.0–36.0)
MCV: 94.9 fL (ref 80.0–100.0)
MPV: 12.3 fL (ref 7.5–12.5)
Platelets: 201 Thousand/uL (ref 140–400)
RBC: 4.49 Million/uL (ref 4.20–5.80)
RDW: 12.2 % (ref 11.0–15.0)
WBC: 7.6 Thousand/uL (ref 3.8–10.8)

## 2018-12-16 LAB — COMPLETE METABOLIC PANEL WITH GFR
AG Ratio: 1.6 (calc) (ref 1.0–2.5)
ALT: 56 U/L — ABNORMAL HIGH (ref 9–46)
AST: 48 U/L — ABNORMAL HIGH (ref 10–35)
Albumin: 4.4 g/dL (ref 3.6–5.1)
Alkaline phosphatase (APISO): 112 U/L (ref 35–144)
BUN/Creatinine Ratio: 15 (calc) (ref 6–22)
BUN: 27 mg/dL — ABNORMAL HIGH (ref 7–25)
CO2: 24 mmol/L (ref 20–32)
Calcium: 9.6 mg/dL (ref 8.6–10.3)
Chloride: 104 mmol/L (ref 98–110)
Creat: 1.85 mg/dL — ABNORMAL HIGH (ref 0.70–1.18)
GFR, Est African American: 40 mL/min/{1.73_m2} — ABNORMAL LOW (ref >=60)
GFR, Est Non African American: 34 mL/min/{1.73_m2} — ABNORMAL LOW (ref >=60)
Globulin: 2.8 g/dL (calc) (ref 1.9–3.7)
Glucose, Bld: 89 mg/dL (ref 65–99)
Potassium: 4.1 mmol/L (ref 3.5–5.3)
Sodium: 142 mmol/L (ref 135–146)
Total Bilirubin: 1.3 mg/dL — ABNORMAL HIGH (ref 0.2–1.2)
Total Protein: 7.2 g/dL (ref 6.1–8.1)

## 2018-12-16 LAB — LIPID PANEL W/REFLEX DIRECT LDL
Cholesterol: 126 mg/dL
HDL: 39 mg/dL — ABNORMAL LOW
LDL Cholesterol (Calc): 60 mg/dL
Non-HDL Cholesterol (Calc): 87 mg/dL
Total CHOL/HDL Ratio: 3.2 (calc)
Triglycerides: 209 mg/dL — ABNORMAL HIGH

## 2018-12-16 NOTE — Progress Notes (Signed)
Call daughter Jeral Fruit,   TG up a little but he was not completely fasting. LDL looks great.   His kidney function decrease quite a bit? Liver enzymes are elevated as well.  Make sure not taking any ibuprofen/aleve/goodys.  Make sure drinking enough water during the day.   Recheck in 2 weeks.

## 2018-12-17 DIAGNOSIS — Z602 Problems related to living alone: Secondary | ICD-10-CM | POA: Diagnosis not present

## 2018-12-17 DIAGNOSIS — I10 Essential (primary) hypertension: Secondary | ICD-10-CM | POA: Diagnosis not present

## 2018-12-17 DIAGNOSIS — G4733 Obstructive sleep apnea (adult) (pediatric): Secondary | ICD-10-CM | POA: Diagnosis not present

## 2018-12-17 DIAGNOSIS — G301 Alzheimer's disease with late onset: Secondary | ICD-10-CM | POA: Diagnosis not present

## 2018-12-17 DIAGNOSIS — I442 Atrioventricular block, complete: Secondary | ICD-10-CM | POA: Diagnosis not present

## 2018-12-17 DIAGNOSIS — F0281 Dementia in other diseases classified elsewhere with behavioral disturbance: Secondary | ICD-10-CM | POA: Diagnosis not present

## 2018-12-18 DIAGNOSIS — F0281 Dementia in other diseases classified elsewhere with behavioral disturbance: Secondary | ICD-10-CM | POA: Diagnosis not present

## 2018-12-18 DIAGNOSIS — G301 Alzheimer's disease with late onset: Secondary | ICD-10-CM | POA: Diagnosis not present

## 2018-12-18 DIAGNOSIS — Z602 Problems related to living alone: Secondary | ICD-10-CM | POA: Diagnosis not present

## 2018-12-18 DIAGNOSIS — G4733 Obstructive sleep apnea (adult) (pediatric): Secondary | ICD-10-CM | POA: Diagnosis not present

## 2018-12-18 DIAGNOSIS — I442 Atrioventricular block, complete: Secondary | ICD-10-CM | POA: Diagnosis not present

## 2018-12-18 DIAGNOSIS — I10 Essential (primary) hypertension: Secondary | ICD-10-CM | POA: Diagnosis not present

## 2018-12-19 ENCOUNTER — Other Ambulatory Visit: Payer: Self-pay | Admitting: Physician Assistant

## 2018-12-19 DIAGNOSIS — M5416 Radiculopathy, lumbar region: Secondary | ICD-10-CM

## 2018-12-21 DIAGNOSIS — I10 Essential (primary) hypertension: Secondary | ICD-10-CM | POA: Diagnosis not present

## 2018-12-21 DIAGNOSIS — F0281 Dementia in other diseases classified elsewhere with behavioral disturbance: Secondary | ICD-10-CM | POA: Diagnosis not present

## 2018-12-21 DIAGNOSIS — Z602 Problems related to living alone: Secondary | ICD-10-CM | POA: Diagnosis not present

## 2018-12-21 DIAGNOSIS — G4733 Obstructive sleep apnea (adult) (pediatric): Secondary | ICD-10-CM | POA: Diagnosis not present

## 2018-12-21 DIAGNOSIS — I442 Atrioventricular block, complete: Secondary | ICD-10-CM | POA: Diagnosis not present

## 2018-12-21 DIAGNOSIS — G301 Alzheimer's disease with late onset: Secondary | ICD-10-CM | POA: Diagnosis not present

## 2018-12-21 NOTE — Telephone Encounter (Signed)
No longer on medication list. Please advise.

## 2018-12-22 ENCOUNTER — Telehealth: Payer: Self-pay | Admitting: Neurology

## 2018-12-22 NOTE — Telephone Encounter (Signed)
[facility] sleep order faxed to [phone] with confirmation received.

## 2018-12-23 ENCOUNTER — Telehealth: Payer: Self-pay | Admitting: Neurology

## 2018-12-23 DIAGNOSIS — Z602 Problems related to living alone: Secondary | ICD-10-CM | POA: Diagnosis not present

## 2018-12-23 DIAGNOSIS — G4733 Obstructive sleep apnea (adult) (pediatric): Secondary | ICD-10-CM

## 2018-12-23 DIAGNOSIS — I10 Essential (primary) hypertension: Secondary | ICD-10-CM | POA: Diagnosis not present

## 2018-12-23 DIAGNOSIS — I442 Atrioventricular block, complete: Secondary | ICD-10-CM | POA: Diagnosis not present

## 2018-12-23 DIAGNOSIS — G301 Alzheimer's disease with late onset: Secondary | ICD-10-CM | POA: Diagnosis not present

## 2018-12-23 DIAGNOSIS — F0281 Dementia in other diseases classified elsewhere with behavioral disturbance: Secondary | ICD-10-CM | POA: Diagnosis not present

## 2018-12-23 NOTE — Telephone Encounter (Signed)
Will you see what Anthony Davila says? Ill send the order but need to know what insurance will approve.

## 2018-12-23 NOTE — Telephone Encounter (Signed)
Was able to get in touch with Anthony Davila, he is on vacation.  He states to call the office at [phone] and ask for [facility]

## 2018-12-23 NOTE — Telephone Encounter (Signed)
Call Jeneen Rinks at [facility] and ask if we can use this sleep study or too old?

## 2018-12-23 NOTE — Telephone Encounter (Signed)
[name] called about patient stating his CPAP stopped working completely. She wanted to know status of order.  We did order new sleep study through [facility] but received old sleep study from Dr. Silvano Rusk office this morning dated 12/10/2011. Findings from sleep study: 1. Severe overall obstructive sleep apnea with desaturations to 83%. Severe AHI when non-supine. 2. Frequent severe snoring.  3. No significant leg jerks.  4. Reduced sleep efficiency. Reduced REM sleep.  5. No significant obesity.  6. No significant daytime sleepiness with Epworth sleepiness scale of 3.   Repeat test with CPAP on 01/06/2012 showed CPAP/ BiPAP at 23/19 cm H20 eliminated and improved apneas, hypopneas, and snoring.   Marra Fraga - I pended order for new CPAP please complete settings portion.

## 2018-12-23 NOTE — Telephone Encounter (Signed)
Tried to call Anthony Davila with no answer and vm full.

## 2018-12-24 DIAGNOSIS — I10 Essential (primary) hypertension: Secondary | ICD-10-CM | POA: Diagnosis not present

## 2018-12-24 DIAGNOSIS — F0281 Dementia in other diseases classified elsewhere with behavioral disturbance: Secondary | ICD-10-CM | POA: Diagnosis not present

## 2018-12-24 DIAGNOSIS — G4733 Obstructive sleep apnea (adult) (pediatric): Secondary | ICD-10-CM | POA: Diagnosis not present

## 2018-12-24 DIAGNOSIS — I442 Atrioventricular block, complete: Secondary | ICD-10-CM | POA: Diagnosis not present

## 2018-12-24 DIAGNOSIS — G301 Alzheimer's disease with late onset: Secondary | ICD-10-CM | POA: Diagnosis not present

## 2018-12-24 DIAGNOSIS — Z602 Problems related to living alone: Secondary | ICD-10-CM | POA: Diagnosis not present

## 2018-12-24 NOTE — Telephone Encounter (Signed)
LMOM with [facility] at [phone], was unable to ask for Anthony Davila as I just got a vm, but asked them if 2013 is too old of a sleep study to order new equipment and am awaiting a call back.

## 2018-12-24 NOTE — Telephone Encounter (Signed)
Received notification from [facility] that order received on patient and they will complete registration process.

## 2018-12-25 DIAGNOSIS — K279 Peptic ulcer, site unspecified, unspecified as acute or chronic, without hemorrhage or perforation: Secondary | ICD-10-CM | POA: Diagnosis not present

## 2018-12-25 DIAGNOSIS — I442 Atrioventricular block, complete: Secondary | ICD-10-CM | POA: Diagnosis not present

## 2018-12-25 DIAGNOSIS — K648 Other hemorrhoids: Secondary | ICD-10-CM | POA: Diagnosis not present

## 2018-12-25 DIAGNOSIS — K5790 Diverticulosis of intestine, part unspecified, without perforation or abscess without bleeding: Secondary | ICD-10-CM | POA: Diagnosis not present

## 2018-12-25 DIAGNOSIS — Z602 Problems related to living alone: Secondary | ICD-10-CM | POA: Diagnosis not present

## 2018-12-25 DIAGNOSIS — Z950 Presence of cardiac pacemaker: Secondary | ICD-10-CM | POA: Diagnosis not present

## 2018-12-25 DIAGNOSIS — L821 Other seborrheic keratosis: Secondary | ICD-10-CM | POA: Diagnosis not present

## 2018-12-25 DIAGNOSIS — I10 Essential (primary) hypertension: Secondary | ICD-10-CM | POA: Diagnosis not present

## 2018-12-25 DIAGNOSIS — Z85828 Personal history of other malignant neoplasm of skin: Secondary | ICD-10-CM | POA: Diagnosis not present

## 2018-12-25 DIAGNOSIS — Z9181 History of falling: Secondary | ICD-10-CM | POA: Diagnosis not present

## 2018-12-25 DIAGNOSIS — F0281 Dementia in other diseases classified elsewhere with behavioral disturbance: Secondary | ICD-10-CM | POA: Diagnosis not present

## 2018-12-25 DIAGNOSIS — E291 Testicular hypofunction: Secondary | ICD-10-CM | POA: Diagnosis not present

## 2018-12-25 DIAGNOSIS — G4733 Obstructive sleep apnea (adult) (pediatric): Secondary | ICD-10-CM | POA: Diagnosis not present

## 2018-12-25 DIAGNOSIS — E785 Hyperlipidemia, unspecified: Secondary | ICD-10-CM | POA: Diagnosis not present

## 2018-12-25 DIAGNOSIS — G301 Alzheimer's disease with late onset: Secondary | ICD-10-CM | POA: Diagnosis not present

## 2018-12-25 NOTE — Telephone Encounter (Signed)
Per Caryl Pina at [facility], sleep study should be fine. Order completed and sent to [facility] at [phone] with confirmation received. They will contact patient/family. Leveda Anna, patient's daughter, made aware.

## 2018-12-25 NOTE — Telephone Encounter (Signed)
Spoke with Caryl Pina at [facility] who is checking on this and will get back to me.

## 2018-12-28 DIAGNOSIS — F0281 Dementia in other diseases classified elsewhere with behavioral disturbance: Secondary | ICD-10-CM | POA: Diagnosis not present

## 2018-12-28 DIAGNOSIS — Z602 Problems related to living alone: Secondary | ICD-10-CM | POA: Diagnosis not present

## 2018-12-28 DIAGNOSIS — I442 Atrioventricular block, complete: Secondary | ICD-10-CM | POA: Diagnosis not present

## 2018-12-28 DIAGNOSIS — G4733 Obstructive sleep apnea (adult) (pediatric): Secondary | ICD-10-CM | POA: Diagnosis not present

## 2018-12-28 DIAGNOSIS — G301 Alzheimer's disease with late onset: Secondary | ICD-10-CM | POA: Diagnosis not present

## 2018-12-28 DIAGNOSIS — I10 Essential (primary) hypertension: Secondary | ICD-10-CM | POA: Diagnosis not present

## 2018-12-29 DIAGNOSIS — Z602 Problems related to living alone: Secondary | ICD-10-CM | POA: Diagnosis not present

## 2018-12-29 DIAGNOSIS — G301 Alzheimer's disease with late onset: Secondary | ICD-10-CM | POA: Diagnosis not present

## 2018-12-29 DIAGNOSIS — R748 Abnormal levels of other serum enzymes: Secondary | ICD-10-CM | POA: Diagnosis not present

## 2018-12-29 DIAGNOSIS — I442 Atrioventricular block, complete: Secondary | ICD-10-CM | POA: Diagnosis not present

## 2018-12-29 DIAGNOSIS — G4733 Obstructive sleep apnea (adult) (pediatric): Secondary | ICD-10-CM | POA: Diagnosis not present

## 2018-12-29 DIAGNOSIS — I10 Essential (primary) hypertension: Secondary | ICD-10-CM | POA: Diagnosis not present

## 2018-12-29 DIAGNOSIS — F0281 Dementia in other diseases classified elsewhere with behavioral disturbance: Secondary | ICD-10-CM | POA: Diagnosis not present

## 2018-12-29 DIAGNOSIS — N289 Disorder of kidney and ureter, unspecified: Secondary | ICD-10-CM | POA: Diagnosis not present

## 2018-12-30 LAB — COMPLETE METABOLIC PANEL WITH GFR
AG Ratio: 1.8 (calc) (ref 1.0–2.5)
ALT: 44 U/L (ref 9–46)
AST: 27 U/L (ref 10–35)
Albumin: 4.1 g/dL (ref 3.6–5.1)
Alkaline phosphatase (APISO): 95 U/L (ref 35–144)
BUN/Creatinine Ratio: 21 (calc) (ref 6–22)
BUN: 26 mg/dL — ABNORMAL HIGH (ref 7–25)
CO2: 24 mmol/L (ref 20–32)
Calcium: 8.9 mg/dL (ref 8.6–10.3)
Chloride: 108 mmol/L (ref 98–110)
Creat: 1.22 mg/dL — ABNORMAL HIGH (ref 0.70–1.18)
GFR, Est African American: 66 mL/min/{1.73_m2} (ref >=60)
GFR, Est Non African American: 57 mL/min/{1.73_m2} — ABNORMAL LOW (ref >=60)
Globulin: 2.3 g/dL (calc) (ref 1.9–3.7)
Glucose, Bld: 108 mg/dL — ABNORMAL HIGH (ref 65–99)
Potassium: 4 mmol/L (ref 3.5–5.3)
Sodium: 142 mmol/L (ref 135–146)
Total Bilirubin: 0.8 mg/dL (ref 0.2–1.2)
Total Protein: 6.4 g/dL (ref 6.1–8.1)

## 2018-12-30 NOTE — Progress Notes (Signed)
Kidney function much better! Liver enzymes normal.

## 2019-01-02 ENCOUNTER — Other Ambulatory Visit: Payer: Self-pay | Admitting: Physician Assistant

## 2019-01-04 DIAGNOSIS — G301 Alzheimer's disease with late onset: Secondary | ICD-10-CM | POA: Diagnosis not present

## 2019-01-04 DIAGNOSIS — I442 Atrioventricular block, complete: Secondary | ICD-10-CM | POA: Diagnosis not present

## 2019-01-04 DIAGNOSIS — F0281 Dementia in other diseases classified elsewhere with behavioral disturbance: Secondary | ICD-10-CM | POA: Diagnosis not present

## 2019-01-04 DIAGNOSIS — G4733 Obstructive sleep apnea (adult) (pediatric): Secondary | ICD-10-CM | POA: Diagnosis not present

## 2019-01-04 DIAGNOSIS — Z602 Problems related to living alone: Secondary | ICD-10-CM | POA: Diagnosis not present

## 2019-01-04 DIAGNOSIS — I10 Essential (primary) hypertension: Secondary | ICD-10-CM | POA: Diagnosis not present

## 2019-01-09 ENCOUNTER — Other Ambulatory Visit: Payer: Self-pay | Admitting: Physician Assistant

## 2019-01-11 DIAGNOSIS — F0281 Dementia in other diseases classified elsewhere with behavioral disturbance: Secondary | ICD-10-CM | POA: Diagnosis not present

## 2019-01-11 DIAGNOSIS — I442 Atrioventricular block, complete: Secondary | ICD-10-CM | POA: Diagnosis not present

## 2019-01-11 DIAGNOSIS — G4733 Obstructive sleep apnea (adult) (pediatric): Secondary | ICD-10-CM | POA: Diagnosis not present

## 2019-01-11 DIAGNOSIS — G301 Alzheimer's disease with late onset: Secondary | ICD-10-CM | POA: Diagnosis not present

## 2019-01-11 DIAGNOSIS — I10 Essential (primary) hypertension: Secondary | ICD-10-CM | POA: Diagnosis not present

## 2019-01-11 DIAGNOSIS — Z602 Problems related to living alone: Secondary | ICD-10-CM | POA: Diagnosis not present

## 2019-01-15 ENCOUNTER — Telehealth: Payer: Self-pay | Admitting: Neurology

## 2019-01-15 NOTE — Telephone Encounter (Signed)
Anthony Davila called for patient stating they have not heard about CPAP. She would like a call back [phone].  I called [facility] and LMOM for [name] to check on the status. Awaiting call back.

## 2019-01-15 NOTE — Telephone Encounter (Signed)
Jean made aware.

## 2019-01-15 NOTE — Telephone Encounter (Signed)
Threasa Beards called and states she doesn't have patient in the system. Called [facility] in [place] and [place] to check on order.

## 2019-01-15 NOTE — Telephone Encounter (Signed)
Spoke with Janett Billow at [facility] who states they have the order and will work up as soon as possible. Gave them Jean's contact number to correspond with.

## 2019-01-19 DIAGNOSIS — I442 Atrioventricular block, complete: Secondary | ICD-10-CM | POA: Diagnosis not present

## 2019-01-19 DIAGNOSIS — I10 Essential (primary) hypertension: Secondary | ICD-10-CM | POA: Diagnosis not present

## 2019-01-19 DIAGNOSIS — G4733 Obstructive sleep apnea (adult) (pediatric): Secondary | ICD-10-CM | POA: Diagnosis not present

## 2019-01-19 DIAGNOSIS — G301 Alzheimer's disease with late onset: Secondary | ICD-10-CM | POA: Diagnosis not present

## 2019-01-19 DIAGNOSIS — Z602 Problems related to living alone: Secondary | ICD-10-CM | POA: Diagnosis not present

## 2019-01-19 DIAGNOSIS — F0281 Dementia in other diseases classified elsewhere with behavioral disturbance: Secondary | ICD-10-CM | POA: Diagnosis not present

## 2019-01-24 ENCOUNTER — Other Ambulatory Visit: Payer: Self-pay | Admitting: Physician Assistant

## 2019-01-24 DIAGNOSIS — Z9989 Dependence on other enabling machines and devices: Secondary | ICD-10-CM | POA: Diagnosis not present

## 2019-01-24 DIAGNOSIS — Z85828 Personal history of other malignant neoplasm of skin: Secondary | ICD-10-CM | POA: Diagnosis not present

## 2019-01-24 DIAGNOSIS — Z602 Problems related to living alone: Secondary | ICD-10-CM | POA: Diagnosis not present

## 2019-01-24 DIAGNOSIS — I442 Atrioventricular block, complete: Secondary | ICD-10-CM | POA: Diagnosis not present

## 2019-01-24 DIAGNOSIS — L821 Other seborrheic keratosis: Secondary | ICD-10-CM | POA: Diagnosis not present

## 2019-01-24 DIAGNOSIS — E785 Hyperlipidemia, unspecified: Secondary | ICD-10-CM | POA: Diagnosis not present

## 2019-01-24 DIAGNOSIS — F0281 Dementia in other diseases classified elsewhere with behavioral disturbance: Secondary | ICD-10-CM | POA: Diagnosis not present

## 2019-01-24 DIAGNOSIS — G301 Alzheimer's disease with late onset: Secondary | ICD-10-CM | POA: Diagnosis not present

## 2019-01-24 DIAGNOSIS — E291 Testicular hypofunction: Secondary | ICD-10-CM | POA: Diagnosis not present

## 2019-01-24 DIAGNOSIS — L219 Seborrheic dermatitis, unspecified: Secondary | ICD-10-CM | POA: Diagnosis not present

## 2019-01-24 DIAGNOSIS — I10 Essential (primary) hypertension: Secondary | ICD-10-CM | POA: Diagnosis not present

## 2019-01-24 DIAGNOSIS — K648 Other hemorrhoids: Secondary | ICD-10-CM | POA: Diagnosis not present

## 2019-01-24 DIAGNOSIS — K5790 Diverticulosis of intestine, part unspecified, without perforation or abscess without bleeding: Secondary | ICD-10-CM | POA: Diagnosis not present

## 2019-01-24 DIAGNOSIS — L851 Acquired keratosis [keratoderma] palmaris et plantaris: Secondary | ICD-10-CM | POA: Diagnosis not present

## 2019-01-24 DIAGNOSIS — Z950 Presence of cardiac pacemaker: Secondary | ICD-10-CM | POA: Diagnosis not present

## 2019-01-24 DIAGNOSIS — Z9181 History of falling: Secondary | ICD-10-CM | POA: Diagnosis not present

## 2019-01-24 DIAGNOSIS — G4733 Obstructive sleep apnea (adult) (pediatric): Secondary | ICD-10-CM | POA: Diagnosis not present

## 2019-01-24 DIAGNOSIS — K279 Peptic ulcer, site unspecified, unspecified as acute or chronic, without hemorrhage or perforation: Secondary | ICD-10-CM | POA: Diagnosis not present

## 2019-01-26 DIAGNOSIS — I442 Atrioventricular block, complete: Secondary | ICD-10-CM | POA: Diagnosis not present

## 2019-01-26 DIAGNOSIS — E785 Hyperlipidemia, unspecified: Secondary | ICD-10-CM | POA: Diagnosis not present

## 2019-01-26 DIAGNOSIS — G301 Alzheimer's disease with late onset: Secondary | ICD-10-CM | POA: Diagnosis not present

## 2019-01-26 DIAGNOSIS — F0281 Dementia in other diseases classified elsewhere with behavioral disturbance: Secondary | ICD-10-CM | POA: Diagnosis not present

## 2019-01-26 DIAGNOSIS — K5790 Diverticulosis of intestine, part unspecified, without perforation or abscess without bleeding: Secondary | ICD-10-CM | POA: Diagnosis not present

## 2019-01-26 DIAGNOSIS — I10 Essential (primary) hypertension: Secondary | ICD-10-CM | POA: Diagnosis not present

## 2019-02-02 DIAGNOSIS — E785 Hyperlipidemia, unspecified: Secondary | ICD-10-CM | POA: Diagnosis not present

## 2019-02-02 DIAGNOSIS — G301 Alzheimer's disease with late onset: Secondary | ICD-10-CM | POA: Diagnosis not present

## 2019-02-02 DIAGNOSIS — I10 Essential (primary) hypertension: Secondary | ICD-10-CM | POA: Diagnosis not present

## 2019-02-02 DIAGNOSIS — I442 Atrioventricular block, complete: Secondary | ICD-10-CM | POA: Diagnosis not present

## 2019-02-02 DIAGNOSIS — K5790 Diverticulosis of intestine, part unspecified, without perforation or abscess without bleeding: Secondary | ICD-10-CM | POA: Diagnosis not present

## 2019-02-02 DIAGNOSIS — F0281 Dementia in other diseases classified elsewhere with behavioral disturbance: Secondary | ICD-10-CM | POA: Diagnosis not present

## 2019-02-08 DIAGNOSIS — G301 Alzheimer's disease with late onset: Secondary | ICD-10-CM | POA: Diagnosis not present

## 2019-02-08 DIAGNOSIS — E785 Hyperlipidemia, unspecified: Secondary | ICD-10-CM | POA: Diagnosis not present

## 2019-02-08 DIAGNOSIS — K5790 Diverticulosis of intestine, part unspecified, without perforation or abscess without bleeding: Secondary | ICD-10-CM | POA: Diagnosis not present

## 2019-02-08 DIAGNOSIS — I10 Essential (primary) hypertension: Secondary | ICD-10-CM | POA: Diagnosis not present

## 2019-02-08 DIAGNOSIS — I442 Atrioventricular block, complete: Secondary | ICD-10-CM | POA: Diagnosis not present

## 2019-02-08 DIAGNOSIS — F0281 Dementia in other diseases classified elsewhere with behavioral disturbance: Secondary | ICD-10-CM | POA: Diagnosis not present

## 2019-02-15 ENCOUNTER — Telehealth: Payer: Self-pay | Admitting: Neurology

## 2019-02-15 DIAGNOSIS — E785 Hyperlipidemia, unspecified: Secondary | ICD-10-CM | POA: Diagnosis not present

## 2019-02-15 DIAGNOSIS — I10 Essential (primary) hypertension: Secondary | ICD-10-CM | POA: Diagnosis not present

## 2019-02-15 DIAGNOSIS — F0281 Dementia in other diseases classified elsewhere with behavioral disturbance: Secondary | ICD-10-CM | POA: Diagnosis not present

## 2019-02-15 DIAGNOSIS — G301 Alzheimer's disease with late onset: Secondary | ICD-10-CM | POA: Diagnosis not present

## 2019-02-15 DIAGNOSIS — I442 Atrioventricular block, complete: Secondary | ICD-10-CM | POA: Diagnosis not present

## 2019-02-15 DIAGNOSIS — K5790 Diverticulosis of intestine, part unspecified, without perforation or abscess without bleeding: Secondary | ICD-10-CM | POA: Diagnosis not present

## 2019-02-15 NOTE — Telephone Encounter (Addendum)
[name], patient's ex-wife, called and left vm that she wanted to speak with Luvenia Starch about patient.   I called back to get information. She states patient is going down hill and they need help. She is very tearful on the phone. She states the past month and particularly the past few days patient has been more confused and needs more help. They have looked into placement in SNF but he can not afford, as the cheapest one they have found is $1900 a month and he makes less than that a month. She has even called social services to report him not being safe but nothing has come of their visits.   She states she spoke with him last night and he didn't know where he was at. He is hiding dirty clothes. Not taking his medication and if he does he takes multiple days at once. He isn't showering or eating correctly. He isn't using his CPAP.   They are very worried and need help getting him placed in SNF. They have spoken to social work but haven't gotten anywhere with that either.   I am going to do some research and see if I can't find any helpful programs. Lace Chenevert - any suggestions?

## 2019-02-16 NOTE — Telephone Encounter (Signed)
I did send the following information to [name] via [place]:   Anthony Davila,  I wanted to follow up with you after our conversation about [place] and send some information I have found. I know his income could possibly be too much for this program, but if they looked at income after insurance it might work. The number for Adult Home Care Specialist Supervisor in [facility]: [place], [phone]. In Home Services Supervisor: Hale Drone , [phone]. Adult Protective Services Supervisor: Budd Palmer, [phone]  Adult Guardianship Services Supervisors: Berna Bue, [phone] [place], [phone].  Program Description  The Special Assistance Adult Care Home Special Care Unit Program (SA/SCU) is a [employer] and county program intended for low-income seniors with Alzheimer's Disease or a similar dementia, such as Parkinson's Disease. Via this program, monthly financial assistance to help cover the cost of room and board in special care units (sometimes called memory care units or Alzheimer's care units) in adult care homes (also known as assisted living facilities) is provided.  This program is administered by the [place] Department of Health and [employer]' ([place]) Division of Aging and Adult Services. SA/SCU is a Theatre stage manager Income (SSI) state supplement.  Eligibility Guidelines  In order to be eligible for the SA/SCU program, there are several eligibility requirements that must be met.  One must be a [place] Citizen.  One must be a resident of [place].  One must be 30 years of age or older OR between the ages of 21 and 46 and be defined as disabled by the IT trainer.  One must require the level of care that is consistent with a special care unit in an adult day care home. The need for this level of care must be medically verified.  One must have a diagnosis of Alzheimer's Disease or a related dementia, such as  Vascular Dementia, Parkinson's Disease, Lewy Body Dementia, Pick's Disease, Creutzfeldt-Jakob Disease, or Huntington's Disease.  As of 2019, one must not have a monthly income in excess of $1561. (This income limit has remained consistent the last several years). With this program, marital status does not effect income requirements. If one is married, only the applicant's income is considered.  One may not have resources (countable assets) greater than 2,000 in 2019. Regardless of marital status, only assets fully owned, or partially owned, by an individual are counted.  Benefits and Services  The SA/SCU program provides monthly financial support to assist with the cost of room and board of a special care unit in an adult care home. Special care units are specifically intended for individuals with a diagnosis of Alzheimer's Disease or a related dementia. These units, which as mentioned previously, may also be called Alzheimer's units or memory care units. Those working in these units are specially trained to work with those with memory loss and cognitive issues due to dementia.  As of 2019, the maximum monthly benefit amount is $1,561. (As with income, this figure has not changed in the last several years). One's benefit amount is calculated by subtracting one's countable monthly income from the maximum monthly benefit amount.  If an applicant is eligible for the SA/SCU program, one is automatically eligible for Medicaid. While SA/SCU does not cover the cost of medical care, [facility] covers the cost of medical care, as well as non-medical care, such as personal care assistance.  How to Apply / Learn More  To apply for the SA/SCU program, [facility] residents  should contact their local county Department of Social Services ([place]).  To apply, one must fill out an application in person at their local DDS office. To download a SA/SCU program brochure, click here. In addition, very limited  program information can be found on the [facility] website.  This is all I have so far, but I am going to keep looking. Just wanted to give you something that might help, hope your daughter's surgery went well.  Luvenia Starch, CMA

## 2019-02-17 NOTE — Telephone Encounter (Signed)
LMOM for Anthony Davila letting her know I have sent information to her mychart and to call back with any questions or problems.

## 2019-02-17 NOTE — Telephone Encounter (Signed)
Also sent the following to [name] via mychart:   [name],   I was able to find this information as well. I would suggest applying for medicaid for Anthony Davila and they can deduct the costs of medical expenses from his income, which would include the cost of an Casey and he should be able to qualify. I hope this helps.   People who receive SSI already qualify to receive Medicaid long-term care in [place]. If you don't receive SSI, and you are 53 or older, blind, or disabled, you must have income below $1,012/month for a household of one or $1,372/month for a household of two (in 2018).  If your income is above the limit, you still might be able to qualify for Medicaid if you have medical expenses that meet or exceed the amount of extra income you have. The Division of Medical Assistance [facility]) will calculate how much your monthly income exceeds the Medicaid income limit and then will multiply that amount by six. This amount is your Medicaid deductible. Once you satisfy your deductible, you are eligible for Medicaid for a period of six months. After six months, DMA will assess another deductible.  You can satisfy your deductible by showing DMA that you have medical expenses, including nursing home charges, that equal or exceed your deductible. You do not have to pay medical bills for them to count towards your deductible; you just need to show proof that you incurred the expenses. Because nursing homes are so expensive, the Medicaid deductible is a common way for nursing home residents to become eligible for Medicaid.  Luvenia Starch, CMA

## 2019-02-23 DIAGNOSIS — Z950 Presence of cardiac pacemaker: Secondary | ICD-10-CM | POA: Diagnosis not present

## 2019-02-23 DIAGNOSIS — K279 Peptic ulcer, site unspecified, unspecified as acute or chronic, without hemorrhage or perforation: Secondary | ICD-10-CM | POA: Diagnosis not present

## 2019-02-23 DIAGNOSIS — I442 Atrioventricular block, complete: Secondary | ICD-10-CM | POA: Diagnosis not present

## 2019-02-23 DIAGNOSIS — F0281 Dementia in other diseases classified elsewhere with behavioral disturbance: Secondary | ICD-10-CM | POA: Diagnosis not present

## 2019-02-23 DIAGNOSIS — K5790 Diverticulosis of intestine, part unspecified, without perforation or abscess without bleeding: Secondary | ICD-10-CM | POA: Diagnosis not present

## 2019-02-23 DIAGNOSIS — G301 Alzheimer's disease with late onset: Secondary | ICD-10-CM | POA: Diagnosis not present

## 2019-02-23 DIAGNOSIS — Z9989 Dependence on other enabling machines and devices: Secondary | ICD-10-CM | POA: Diagnosis not present

## 2019-02-23 DIAGNOSIS — L219 Seborrheic dermatitis, unspecified: Secondary | ICD-10-CM | POA: Diagnosis not present

## 2019-02-23 DIAGNOSIS — E785 Hyperlipidemia, unspecified: Secondary | ICD-10-CM | POA: Diagnosis not present

## 2019-02-23 DIAGNOSIS — Z85828 Personal history of other malignant neoplasm of skin: Secondary | ICD-10-CM | POA: Diagnosis not present

## 2019-02-23 DIAGNOSIS — I10 Essential (primary) hypertension: Secondary | ICD-10-CM | POA: Diagnosis not present

## 2019-02-23 DIAGNOSIS — G4733 Obstructive sleep apnea (adult) (pediatric): Secondary | ICD-10-CM | POA: Diagnosis not present

## 2019-02-23 DIAGNOSIS — L851 Acquired keratosis [keratoderma] palmaris et plantaris: Secondary | ICD-10-CM | POA: Diagnosis not present

## 2019-02-26 DIAGNOSIS — I442 Atrioventricular block, complete: Secondary | ICD-10-CM | POA: Diagnosis not present

## 2019-02-26 DIAGNOSIS — G301 Alzheimer's disease with late onset: Secondary | ICD-10-CM | POA: Diagnosis not present

## 2019-02-26 DIAGNOSIS — F0281 Dementia in other diseases classified elsewhere with behavioral disturbance: Secondary | ICD-10-CM | POA: Diagnosis not present

## 2019-02-26 DIAGNOSIS — I10 Essential (primary) hypertension: Secondary | ICD-10-CM | POA: Diagnosis not present

## 2019-02-26 DIAGNOSIS — E785 Hyperlipidemia, unspecified: Secondary | ICD-10-CM | POA: Diagnosis not present

## 2019-02-26 DIAGNOSIS — K5790 Diverticulosis of intestine, part unspecified, without perforation or abscess without bleeding: Secondary | ICD-10-CM | POA: Diagnosis not present

## 2019-03-01 DIAGNOSIS — I442 Atrioventricular block, complete: Secondary | ICD-10-CM | POA: Diagnosis not present

## 2019-03-01 DIAGNOSIS — F0281 Dementia in other diseases classified elsewhere with behavioral disturbance: Secondary | ICD-10-CM | POA: Diagnosis not present

## 2019-03-01 DIAGNOSIS — I10 Essential (primary) hypertension: Secondary | ICD-10-CM | POA: Diagnosis not present

## 2019-03-01 DIAGNOSIS — K5790 Diverticulosis of intestine, part unspecified, without perforation or abscess without bleeding: Secondary | ICD-10-CM | POA: Diagnosis not present

## 2019-03-01 DIAGNOSIS — E785 Hyperlipidemia, unspecified: Secondary | ICD-10-CM | POA: Diagnosis not present

## 2019-03-01 DIAGNOSIS — G301 Alzheimer's disease with late onset: Secondary | ICD-10-CM | POA: Diagnosis not present

## 2019-03-02 DIAGNOSIS — I10 Essential (primary) hypertension: Secondary | ICD-10-CM | POA: Diagnosis not present

## 2019-03-02 DIAGNOSIS — I471 Supraventricular tachycardia: Secondary | ICD-10-CM | POA: Diagnosis not present

## 2019-03-02 DIAGNOSIS — I442 Atrioventricular block, complete: Secondary | ICD-10-CM | POA: Diagnosis not present

## 2019-03-08 DIAGNOSIS — E785 Hyperlipidemia, unspecified: Secondary | ICD-10-CM | POA: Diagnosis not present

## 2019-03-08 DIAGNOSIS — I10 Essential (primary) hypertension: Secondary | ICD-10-CM | POA: Diagnosis not present

## 2019-03-08 DIAGNOSIS — F0281 Dementia in other diseases classified elsewhere with behavioral disturbance: Secondary | ICD-10-CM | POA: Diagnosis not present

## 2019-03-08 DIAGNOSIS — G301 Alzheimer's disease with late onset: Secondary | ICD-10-CM | POA: Diagnosis not present

## 2019-03-08 DIAGNOSIS — K5790 Diverticulosis of intestine, part unspecified, without perforation or abscess without bleeding: Secondary | ICD-10-CM | POA: Diagnosis not present

## 2019-03-08 DIAGNOSIS — I442 Atrioventricular block, complete: Secondary | ICD-10-CM | POA: Diagnosis not present

## 2019-03-11 DIAGNOSIS — M5416 Radiculopathy, lumbar region: Secondary | ICD-10-CM | POA: Diagnosis not present

## 2019-03-11 DIAGNOSIS — M5412 Radiculopathy, cervical region: Secondary | ICD-10-CM | POA: Diagnosis not present

## 2019-03-11 DIAGNOSIS — M542 Cervicalgia: Secondary | ICD-10-CM | POA: Diagnosis not present

## 2019-03-14 ENCOUNTER — Other Ambulatory Visit: Payer: Self-pay | Admitting: Physician Assistant

## 2019-03-14 DIAGNOSIS — F411 Generalized anxiety disorder: Secondary | ICD-10-CM

## 2019-03-14 DIAGNOSIS — R252 Cramp and spasm: Secondary | ICD-10-CM

## 2019-03-17 DIAGNOSIS — I10 Essential (primary) hypertension: Secondary | ICD-10-CM | POA: Diagnosis not present

## 2019-03-17 DIAGNOSIS — F0281 Dementia in other diseases classified elsewhere with behavioral disturbance: Secondary | ICD-10-CM | POA: Diagnosis not present

## 2019-03-17 DIAGNOSIS — K5790 Diverticulosis of intestine, part unspecified, without perforation or abscess without bleeding: Secondary | ICD-10-CM | POA: Diagnosis not present

## 2019-03-17 DIAGNOSIS — E785 Hyperlipidemia, unspecified: Secondary | ICD-10-CM | POA: Diagnosis not present

## 2019-03-17 DIAGNOSIS — I442 Atrioventricular block, complete: Secondary | ICD-10-CM | POA: Diagnosis not present

## 2019-03-17 DIAGNOSIS — G301 Alzheimer's disease with late onset: Secondary | ICD-10-CM | POA: Diagnosis not present

## 2019-03-31 DIAGNOSIS — M5412 Radiculopathy, cervical region: Secondary | ICD-10-CM | POA: Diagnosis not present

## 2019-04-02 ENCOUNTER — Encounter: Payer: Self-pay | Admitting: Physician Assistant

## 2019-04-02 DIAGNOSIS — H527 Unspecified disorder of refraction: Secondary | ICD-10-CM | POA: Diagnosis not present

## 2019-04-02 DIAGNOSIS — H25813 Combined forms of age-related cataract, bilateral: Secondary | ICD-10-CM | POA: Diagnosis not present

## 2019-04-02 DIAGNOSIS — H02834 Dermatochalasis of left upper eyelid: Secondary | ICD-10-CM | POA: Diagnosis not present

## 2019-04-02 DIAGNOSIS — H02831 Dermatochalasis of right upper eyelid: Secondary | ICD-10-CM | POA: Diagnosis not present

## 2019-04-21 DIAGNOSIS — M5412 Radiculopathy, cervical region: Secondary | ICD-10-CM | POA: Diagnosis not present

## 2019-04-28 ENCOUNTER — Ambulatory Visit (INDEPENDENT_AMBULATORY_CARE_PROVIDER_SITE_OTHER): Payer: Medicare Other | Admitting: Physician Assistant

## 2019-04-28 ENCOUNTER — Encounter: Payer: Self-pay | Admitting: Physician Assistant

## 2019-04-28 VITALS — Ht 67.75 in | Wt 175.0 lb

## 2019-04-28 DIAGNOSIS — G301 Alzheimer's disease with late onset: Secondary | ICD-10-CM

## 2019-04-28 DIAGNOSIS — R4689 Other symptoms and signs involving appearance and behavior: Secondary | ICD-10-CM | POA: Diagnosis not present

## 2019-04-28 DIAGNOSIS — R4182 Altered mental status, unspecified: Secondary | ICD-10-CM

## 2019-04-28 DIAGNOSIS — Z91199 Patient's noncompliance with other medical treatment and regimen due to unspecified reason: Secondary | ICD-10-CM | POA: Insufficient documentation

## 2019-04-28 DIAGNOSIS — F0281 Dementia in other diseases classified elsewhere with behavioral disturbance: Secondary | ICD-10-CM

## 2019-04-28 DIAGNOSIS — Z9119 Patient's noncompliance with other medical treatment and regimen: Secondary | ICD-10-CM

## 2019-04-28 DIAGNOSIS — F02818 Dementia in other diseases classified elsewhere, unspecified severity, with other behavioral disturbance: Secondary | ICD-10-CM

## 2019-04-28 NOTE — Progress Notes (Signed)
Changed to virtual/ can't get him in the office  Wants to discuss SNF

## 2019-04-28 NOTE — Progress Notes (Signed)
   Subjective:    Patient ID: Anthony Davila, male    DOB: [birth year], 79 y.o.   MRN: [id number]  HPI Pt is a 79 yo male with late onset    Review of Systems     Objective:   Physical Exam        Assessment & Plan:  Marland KitchenMarland KitchenCalvyn was seen today for advice only.  Diagnoses and all orders for this visit:  Late onset Alzheimer's disease with behavioral disturbance ([place])  Combative behavior  Verbally abusive behavior  Altered mental status, unspecified altered mental status type  Medical non-compliance

## 2019-04-29 NOTE — Progress Notes (Signed)
Patient ID: Anthony Davila, male   DOB: [birth year], 79 y.o.   MRN: [id number] .Marland KitchenVirtual Visit via Telephone Note  I connected with Ledell Peoples Hillmann on 04/28/2019 at  1:40 PM EDT by telephone and verified that I am speaking with the correct person using two identifiers.  Location: Patient: home Provider: clinic   I discussed the limitations, risks, security and privacy concerns of performing an evaluation and management service by telephone and the availability of in person appointments. I also discussed with the patient that there may be a patient responsible charge related to this service. The patient expressed understanding and agreed to proceed.   History of Present Illness: Pt is a 79 yo male with late onset Alzheimers and rapidly declining. I spoke with ex-wife and caregiver today. He is not taking any medication. He is combative and verbally abusive. He is not eating and living with things "thrown everywhere". He is not able to go out in public because he does inappropriate things like touch people. She comes in and helps him from day to day but she cannot care for him anymore. They are having a lot of trouble finding a place they can afford. She wants to know what to do.   .. Active Ambulatory Problems    Diagnosis Date Noted  . Rosacea 02/20/2015  . Essential hypertension 02/20/2015  . Hyperlipidemia 02/20/2015  . OSA on CPAP 02/20/2015  . Left lumbar radiculitis 02/20/2015  . Hypertriglyceridemia 02/20/2015  . Memory loss 02/20/2015  . Vitamin D deficiency 03/07/2015  . Generalized anxiety disorder 03/20/2015  . Neck pain 09/08/2015  . New onset of headaches after age 79 11/03/2015  . Problems of guilt 02/06/2016  . Family dynamics problem 02/06/2016  . Migraine without aura and without status migrainosus, not intractable 02/06/2016  . DDD (degenerative disc disease), cervical 06/05/2016  . 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 Late onset Alzheimer's  disease without behavioral disturbance ([employer]) 11/01/2016  . 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 Complete heart block ([place]) 11/01/2016  . Epigastric discomfort 11/01/2016  . Gastroesophageal reflux disease without esophagitis 11/01/2016  . Bilateral hearing loss 12/25/2016  . Hypokalemia 11/03/2017  . Chronic right shoulder pain 07/24/2018  . Altered mental status 04/28/2019  . Verbally abusive behavior 04/28/2019  . Combative behavior 04/28/2019  . Medical non-compliance 04/28/2019   Resolved Ambulatory Problems    Diagnosis Date Noted  . Cervical spondylosis without myelopathy 09/08/2015  . Mild cognitive impairment 02/06/2016  . Cervical spondylosis with myelopathy and radiculopathy 06/05/2016  . Jerking movements of extremities 11/03/2017  . Sensation of feeling hot 11/03/2017   No Additional Past Medical History   Reviewed med, allergy, problem list.     Observations/Objective: Did not speak to patient.   Patients ex-wife and caregiver was very emotional about his behavior.    Assessment and Plan: Marland KitchenMarland KitchenDestry was seen today for advice only.  Diagnoses and all orders for this visit:  Late onset Alzheimer's disease with behavioral disturbance ([place])  Combative behavior  Verbally abusive behavior  Altered mental status, unspecified altered mental status type  Medical non-compliance   Recommended to take to ED for altered mental status for evaluation and make sure there is no infection causing this to be worse. At that time hopefully social worker may be able to help place him in a home. He is not able to be alone and his ex-wife is unable to look after him.    Follow Up Instructions:    I discussed  the assessment and treatment plan with the patient. The patient was provided an opportunity to ask questions and all were answered. The patient agreed with the plan and demonstrated an understanding of the instructions.   The patient was advised to call back or seek an  in-person evaluation if the symptoms worsen or if the condition fails to improve as anticipated.  I provided 15 minutes of non-face-to-face time during this encounter.   Iran Planas, PA-C

## 2019-05-24 ENCOUNTER — Telehealth: Payer: Self-pay | Admitting: Neurology

## 2019-05-24 NOTE — Telephone Encounter (Signed)
[name] called, she states she has applied for medicaid for her father but needs H&P and FL2 filled out.

## 2019-05-24 NOTE — Telephone Encounter (Signed)
Form printed and given to [facility] to complete. LMOM letting daughter know.

## 2019-05-26 NOTE — Telephone Encounter (Signed)
Daughter called wanting update on paperwork- called and left her a message letting her know we would call her when completed- we allow several days for paperwork to be completed- especially since Luvenia Starch is working remotely.

## 2019-06-02 DIAGNOSIS — I472 Ventricular tachycardia: Secondary | ICD-10-CM | POA: Diagnosis not present

## 2019-06-15 ENCOUNTER — Ambulatory Visit: Payer: Medicare Other | Admitting: Physician Assistant

## 2019-07-03 DIAGNOSIS — Z79899 Other long term (current) drug therapy: Secondary | ICD-10-CM | POA: Diagnosis not present

## 2019-07-03 DIAGNOSIS — G4733 Obstructive sleep apnea (adult) (pediatric): Secondary | ICD-10-CM | POA: Diagnosis not present

## 2019-07-03 DIAGNOSIS — Z888 Allergy status to other drugs, medicaments and biological substances status: Secondary | ICD-10-CM | POA: Diagnosis not present

## 2019-07-03 DIAGNOSIS — Z8719 Personal history of other diseases of the digestive system: Secondary | ICD-10-CM | POA: Diagnosis not present

## 2019-07-03 DIAGNOSIS — Z85828 Personal history of other malignant neoplasm of skin: Secondary | ICD-10-CM | POA: Diagnosis not present

## 2019-07-03 DIAGNOSIS — I442 Atrioventricular block, complete: Secondary | ICD-10-CM | POA: Diagnosis not present

## 2019-07-03 DIAGNOSIS — D649 Anemia, unspecified: Secondary | ICD-10-CM | POA: Diagnosis not present

## 2019-07-03 DIAGNOSIS — E876 Hypokalemia: Secondary | ICD-10-CM | POA: Diagnosis not present

## 2019-07-03 DIAGNOSIS — Z950 Presence of cardiac pacemaker: Secondary | ICD-10-CM | POA: Diagnosis not present

## 2019-07-03 DIAGNOSIS — Z791 Long term (current) use of non-steroidal anti-inflammatories (NSAID): Secondary | ICD-10-CM | POA: Diagnosis not present

## 2019-07-03 DIAGNOSIS — J449 Chronic obstructive pulmonary disease, unspecified: Secondary | ICD-10-CM | POA: Diagnosis not present

## 2019-07-03 DIAGNOSIS — Z9114 Patient's other noncompliance with medication regimen: Secondary | ICD-10-CM | POA: Diagnosis not present

## 2019-07-03 DIAGNOSIS — R946 Abnormal results of thyroid function studies: Secondary | ICD-10-CM | POA: Diagnosis not present

## 2019-07-03 DIAGNOSIS — Z87891 Personal history of nicotine dependence: Secondary | ICD-10-CM | POA: Diagnosis not present

## 2019-07-03 DIAGNOSIS — Z803 Family history of malignant neoplasm of breast: Secondary | ICD-10-CM | POA: Diagnosis not present

## 2019-07-03 DIAGNOSIS — G301 Alzheimer's disease with late onset: Secondary | ICD-10-CM | POA: Diagnosis not present

## 2019-07-03 DIAGNOSIS — D696 Thrombocytopenia, unspecified: Secondary | ICD-10-CM | POA: Diagnosis not present

## 2019-07-03 DIAGNOSIS — F0280 Dementia in other diseases classified elsewhere without behavioral disturbance: Secondary | ICD-10-CM | POA: Diagnosis not present

## 2019-07-03 DIAGNOSIS — E785 Hyperlipidemia, unspecified: Secondary | ICD-10-CM | POA: Diagnosis not present

## 2019-07-03 DIAGNOSIS — I491 Atrial premature depolarization: Secondary | ICD-10-CM | POA: Diagnosis not present

## 2019-07-03 DIAGNOSIS — I10 Essential (primary) hypertension: Secondary | ICD-10-CM | POA: Diagnosis not present

## 2019-07-03 DIAGNOSIS — N179 Acute kidney failure, unspecified: Secondary | ICD-10-CM | POA: Diagnosis not present

## 2019-07-03 DIAGNOSIS — Z7982 Long term (current) use of aspirin: Secondary | ICD-10-CM | POA: Diagnosis not present

## 2019-07-08 DIAGNOSIS — Z7982 Long term (current) use of aspirin: Secondary | ICD-10-CM | POA: Diagnosis not present

## 2019-07-08 DIAGNOSIS — I10 Essential (primary) hypertension: Secondary | ICD-10-CM | POA: Diagnosis not present

## 2019-07-08 DIAGNOSIS — F0390 Unspecified dementia without behavioral disturbance: Secondary | ICD-10-CM | POA: Diagnosis not present

## 2019-07-08 DIAGNOSIS — J449 Chronic obstructive pulmonary disease, unspecified: Secondary | ICD-10-CM | POA: Diagnosis not present

## 2019-07-08 DIAGNOSIS — I442 Atrioventricular block, complete: Secondary | ICD-10-CM | POA: Diagnosis not present

## 2019-07-08 DIAGNOSIS — F0280 Dementia in other diseases classified elsewhere without behavioral disturbance: Secondary | ICD-10-CM | POA: Diagnosis not present

## 2019-07-08 DIAGNOSIS — G4730 Sleep apnea, unspecified: Secondary | ICD-10-CM | POA: Diagnosis not present

## 2019-07-08 DIAGNOSIS — Z87891 Personal history of nicotine dependence: Secondary | ICD-10-CM | POA: Diagnosis not present

## 2019-07-08 DIAGNOSIS — Z950 Presence of cardiac pacemaker: Secondary | ICD-10-CM | POA: Diagnosis not present

## 2019-07-08 DIAGNOSIS — G301 Alzheimer's disease with late onset: Secondary | ICD-10-CM | POA: Diagnosis not present

## 2019-07-08 DIAGNOSIS — N179 Acute kidney failure, unspecified: Secondary | ICD-10-CM | POA: Diagnosis not present

## 2019-07-08 DIAGNOSIS — E785 Hyperlipidemia, unspecified: Secondary | ICD-10-CM | POA: Diagnosis not present

## 2019-07-08 DIAGNOSIS — D696 Thrombocytopenia, unspecified: Secondary | ICD-10-CM | POA: Diagnosis not present

## 2019-07-08 DIAGNOSIS — Z79899 Other long term (current) drug therapy: Secondary | ICD-10-CM | POA: Diagnosis not present

## 2019-07-08 DIAGNOSIS — Z888 Allergy status to other drugs, medicaments and biological substances status: Secondary | ICD-10-CM | POA: Diagnosis not present

## 2019-07-09 DIAGNOSIS — F0390 Unspecified dementia without behavioral disturbance: Secondary | ICD-10-CM | POA: Diagnosis not present

## 2019-07-09 DIAGNOSIS — Z87891 Personal history of nicotine dependence: Secondary | ICD-10-CM | POA: Diagnosis not present

## 2019-07-09 DIAGNOSIS — Z888 Allergy status to other drugs, medicaments and biological substances status: Secondary | ICD-10-CM | POA: Diagnosis not present

## 2019-07-09 DIAGNOSIS — Z7982 Long term (current) use of aspirin: Secondary | ICD-10-CM | POA: Diagnosis not present

## 2019-07-09 DIAGNOSIS — E785 Hyperlipidemia, unspecified: Secondary | ICD-10-CM | POA: Diagnosis not present

## 2019-07-09 DIAGNOSIS — Z79899 Other long term (current) drug therapy: Secondary | ICD-10-CM | POA: Diagnosis not present

## 2019-07-12 DIAGNOSIS — F29 Unspecified psychosis not due to a substance or known physiological condition: Secondary | ICD-10-CM | POA: Diagnosis present

## 2019-07-12 DIAGNOSIS — G4733 Obstructive sleep apnea (adult) (pediatric): Secondary | ICD-10-CM | POA: Diagnosis present

## 2019-07-12 DIAGNOSIS — N289 Disorder of kidney and ureter, unspecified: Secondary | ICD-10-CM | POA: Diagnosis not present

## 2019-07-12 DIAGNOSIS — I10 Essential (primary) hypertension: Secondary | ICD-10-CM | POA: Diagnosis present

## 2019-07-12 DIAGNOSIS — M6281 Muscle weakness (generalized): Secondary | ICD-10-CM | POA: Diagnosis present

## 2019-07-12 DIAGNOSIS — D649 Anemia, unspecified: Secondary | ICD-10-CM | POA: Diagnosis not present

## 2019-07-12 DIAGNOSIS — Z9114 Patient's other noncompliance with medication regimen: Secondary | ICD-10-CM | POA: Diagnosis present

## 2019-07-12 DIAGNOSIS — G301 Alzheimer's disease with late onset: Secondary | ICD-10-CM | POA: Diagnosis present

## 2019-07-12 DIAGNOSIS — R278 Other lack of coordination: Secondary | ICD-10-CM | POA: Diagnosis present

## 2019-07-12 DIAGNOSIS — D509 Iron deficiency anemia, unspecified: Secondary | ICD-10-CM | POA: Diagnosis present

## 2019-07-12 DIAGNOSIS — F0280 Dementia in other diseases classified elsewhere without behavioral disturbance: Secondary | ICD-10-CM | POA: Diagnosis present

## 2019-07-12 DIAGNOSIS — R2681 Unsteadiness on feet: Secondary | ICD-10-CM | POA: Diagnosis present

## 2019-07-12 DIAGNOSIS — J449 Chronic obstructive pulmonary disease, unspecified: Secondary | ICD-10-CM | POA: Diagnosis present

## 2019-07-12 DIAGNOSIS — Z950 Presence of cardiac pacemaker: Secondary | ICD-10-CM | POA: Diagnosis not present

## 2019-07-12 DIAGNOSIS — R2689 Other abnormalities of gait and mobility: Secondary | ICD-10-CM | POA: Diagnosis present

## 2019-07-12 DIAGNOSIS — E559 Vitamin D deficiency, unspecified: Secondary | ICD-10-CM | POA: Diagnosis not present

## 2019-07-12 DIAGNOSIS — R488 Other symbolic dysfunctions: Secondary | ICD-10-CM | POA: Diagnosis present

## 2019-07-12 DIAGNOSIS — D696 Thrombocytopenia, unspecified: Secondary | ICD-10-CM | POA: Diagnosis present

## 2019-07-12 DIAGNOSIS — R1312 Dysphagia, oropharyngeal phase: Secondary | ICD-10-CM | POA: Diagnosis present

## 2019-07-12 DIAGNOSIS — Z8679 Personal history of other diseases of the circulatory system: Secondary | ICD-10-CM | POA: Diagnosis not present

## 2019-07-12 DIAGNOSIS — E785 Hyperlipidemia, unspecified: Secondary | ICD-10-CM | POA: Diagnosis present

## 2019-07-14 DIAGNOSIS — F0280 Dementia in other diseases classified elsewhere without behavioral disturbance: Secondary | ICD-10-CM | POA: Diagnosis not present

## 2019-07-14 DIAGNOSIS — D649 Anemia, unspecified: Secondary | ICD-10-CM | POA: Diagnosis not present

## 2019-07-14 DIAGNOSIS — G301 Alzheimer's disease with late onset: Secondary | ICD-10-CM | POA: Diagnosis not present

## 2019-07-14 DIAGNOSIS — J449 Chronic obstructive pulmonary disease, unspecified: Secondary | ICD-10-CM | POA: Diagnosis not present

## 2019-07-24 DIAGNOSIS — G301 Alzheimer's disease with late onset: Secondary | ICD-10-CM | POA: Diagnosis not present

## 2019-07-24 DIAGNOSIS — D649 Anemia, unspecified: Secondary | ICD-10-CM | POA: Diagnosis not present

## 2019-07-24 DIAGNOSIS — N289 Disorder of kidney and ureter, unspecified: Secondary | ICD-10-CM | POA: Diagnosis not present

## 2019-07-24 DIAGNOSIS — E559 Vitamin D deficiency, unspecified: Secondary | ICD-10-CM | POA: Diagnosis not present

## 2019-09-28 DIAGNOSIS — D649 Anemia, unspecified: Secondary | ICD-10-CM | POA: Diagnosis not present

## 2022-01-21 ENCOUNTER — Telehealth: Payer: Self-pay | Admitting: General Practice

## 2022-01-21 NOTE — Telephone Encounter (Signed)
Transition Care Management Unsuccessful Follow-up Telephone Call  Date of discharge and from where:  01/19/22 from [facility]  Attempts:  1st Attempt  Reason for unsuccessful TCM follow-up call:  Left voice message

## 2022-01-24 NOTE — Telephone Encounter (Signed)
Transition Care Management Unsuccessful Follow-up Telephone Call  Date of discharge and from where:  01/19/22 from [facility]  Attempts:  2nd Attempt  Reason for unsuccessful TCM follow-up call:  No answer/busy

## 2022-01-25 NOTE — Telephone Encounter (Addendum)
Transition Care Management Unsuccessful Follow-up Telephone Call  Date of discharge and from where:  01/19/22 from [facility]  Attempts:  3rd attempt  Reason for unsuccessful TCM follow-up call:  No answer/busy

## 2022-04-04 ENCOUNTER — Telehealth: Payer: Self-pay | Admitting: General Practice

## 2022-04-04 NOTE — Transitions of Care (Post Inpatient/ED Visit) (Signed)
   04/04/2022  Name: Anthony Davila MRN: [id number] DOB: January 06, 1941  Today's TOC FU Call Status: Today's TOC FU Call Status:: Unsuccessul Call (1st Attempt) Unsuccessful Call (1st Attempt) Date: 04/04/22  Attempted to reach the patient regarding the most recent Inpatient/ED visit.  Follow Up Plan: Additional outreach attempts will be made to reach the patient to complete the Transitions of Care (Post Inpatient/ED visit) call.   Signature Tinnie Gens, RN BSN

## 2022-04-08 NOTE — Transitions of Care (Post Inpatient/ED Visit) (Signed)
   04/08/2022  Name: Anthony Davila MRN: [id number] DOB: May 21, 1940  Today's TOC FU Call Status: Today's TOC FU Call Status:: Unsuccessful Call (2nd Attempt) Unsuccessful Call (1st Attempt) Date: 04/04/22 Unsuccessful Call (2nd Attempt) Date: 04/08/22  Attempted to reach the patient regarding the most recent Inpatient/ED visit.  Follow Up Plan: Additional outreach attempts will be made to reach the patient to complete the Transitions of Care (Post Inpatient/ED visit) call.   Signature Tinnie Gens, RN BSN

## 2022-04-09 NOTE — Transitions of Care (Post Inpatient/ED Visit) (Signed)
   04/09/2022  Name: Anthony Davila MRN: [id number] DOB: December 28, 1940  Today's TOC FU Call Status: Today's TOC FU Call Status:: Unsuccessful Call (3rd Attempt) Unsuccessful Call (1st Attempt) Date: 04/04/22 Unsuccessful Call (2nd Attempt) Date: 04/08/22 Unsuccessful Call (3rd Attempt) Date: 04/09/22  Attempted to reach the patient regarding the most recent Inpatient/ED visit.  Follow Up Plan: No further outreach attempts will be made at this time. We have been unable to contact the patient.  Signature Tinnie Gens, RN BSN
